# Patient Record
Sex: Female | Born: 1950 | Race: Black or African American | Hispanic: No | Smoking: Never smoker
Health system: Southern US, Community
[De-identification: ages and names within clinical notes are randomized; demographics above are authoritative.]

## PROBLEM LIST (undated history)

## (undated) DIAGNOSIS — E7800 Pure hypercholesterolemia, unspecified: Secondary | ICD-10-CM

## (undated) DIAGNOSIS — R569 Unspecified convulsions: Secondary | ICD-10-CM

## (undated) DIAGNOSIS — J449 Chronic obstructive pulmonary disease, unspecified: Secondary | ICD-10-CM

## (undated) DIAGNOSIS — E119 Type 2 diabetes mellitus without complications: Secondary | ICD-10-CM

## (undated) DIAGNOSIS — I10 Essential (primary) hypertension: Secondary | ICD-10-CM

## (undated) DIAGNOSIS — I639 Cerebral infarction, unspecified: Secondary | ICD-10-CM

## (undated) DIAGNOSIS — I2510 Atherosclerotic heart disease of native coronary artery without angina pectoris: Secondary | ICD-10-CM

## (undated) DIAGNOSIS — R519 Headache, unspecified: Secondary | ICD-10-CM

## (undated) DIAGNOSIS — R51 Headache: Secondary | ICD-10-CM

## (undated) DIAGNOSIS — M1990 Unspecified osteoarthritis, unspecified site: Secondary | ICD-10-CM

## (undated) HISTORY — PX: NASAL SEPTUM SURGERY: SHX37

## (undated) HISTORY — PX: CERVICAL DISC SURGERY: SHX588

## (undated) HISTORY — PX: BACK SURGERY: SHX140

## (undated) HISTORY — PX: VAGINAL HYSTERECTOMY: SUR661

## (undated) HISTORY — PX: FOOT SURGERY: SHX648

## (undated) HISTORY — PX: CARPAL TUNNEL RELEASE: SHX101

---

## 2001-07-19 ENCOUNTER — Ambulatory Visit (HOSPITAL_COMMUNITY): Admission: RE | Admit: 2001-07-19 | Discharge: 2001-07-19 | Payer: Self-pay | Admitting: *Deleted

## 2001-07-19 ENCOUNTER — Encounter: Payer: Self-pay | Admitting: *Deleted

## 2007-07-20 ENCOUNTER — Emergency Department: Payer: Self-pay | Admitting: Emergency Medicine

## 2008-07-02 ENCOUNTER — Emergency Department: Payer: Self-pay | Admitting: Emergency Medicine

## 2012-01-27 HISTORY — PX: CORONARY ANGIOPLASTY WITH STENT PLACEMENT: SHX49

## 2012-09-03 ENCOUNTER — Emergency Department: Payer: Self-pay | Admitting: Emergency Medicine

## 2012-09-03 LAB — COMPREHENSIVE METABOLIC PANEL
Alkaline Phosphatase: 145 U/L — ABNORMAL HIGH (ref 50–136)
Anion Gap: 8 (ref 7–16)
BUN: 11 mg/dL (ref 7–18)
Bilirubin,Total: 0.5 mg/dL (ref 0.2–1.0)
Calcium, Total: 9.5 mg/dL (ref 8.5–10.1)
Co2: 24 mmol/L (ref 21–32)
Creatinine: 0.99 mg/dL (ref 0.60–1.30)
EGFR (Non-African Amer.): >60
Glucose: 139 mg/dL — ABNORMAL HIGH (ref 65–99)
Osmolality: 287 (ref 275–301)
Potassium: 3.9 mmol/L (ref 3.5–5.1)
SGOT(AST): 23 U/L (ref 15–37)
SGPT (ALT): 26 U/L (ref 12–78)
Sodium: 143 mmol/L (ref 136–145)

## 2012-09-03 LAB — CBC
HGB: 13.5 g/dL (ref 12.0–16.0)
MCV: 85 fL (ref 80–100)
Platelet: 207 10*3/uL (ref 150–440)
RDW: 14.5 % (ref 11.5–14.5)

## 2012-09-03 LAB — CK TOTAL AND CKMB (NOT AT ARMC)
CK, Total: 301 U/L — ABNORMAL HIGH (ref 21–215)
CK-MB: 4.3 ng/mL — ABNORMAL HIGH (ref 0.5–3.6)

## 2012-10-21 ENCOUNTER — Observation Stay: Payer: Self-pay | Admitting: Obstetrics and Gynecology

## 2012-10-21 LAB — CBC
HGB: 11.4 g/dL — ABNORMAL LOW (ref 12.0–16.0)
MCHC: 33.7 g/dL (ref 32.0–36.0)
MCV: 85 fL (ref 80–100)
Platelet: 200 10*3/uL (ref 150–440)
RBC: 4.56 10*6/uL (ref 3.80–5.20)
RBC: 3.92 10*6/uL (ref 3.80–5.20)
RDW: 14 % (ref 11.5–14.5)
WBC: 12.6 10*3/uL — ABNORMAL HIGH (ref 3.6–11.0)
WBC: 10.2 10*3/uL (ref 3.6–11.0)

## 2012-10-21 LAB — BASIC METABOLIC PANEL
BUN: 16 mg/dL (ref 7–18)
Chloride: 111 mmol/L — ABNORMAL HIGH (ref 98–107)
EGFR (Non-African Amer.): >60
Glucose: 152 mg/dL — ABNORMAL HIGH (ref 65–99)
Osmolality: 285 (ref 275–301)
Potassium: 3.8 mmol/L (ref 3.5–5.1)

## 2012-11-08 ENCOUNTER — Emergency Department: Payer: Self-pay | Admitting: Emergency Medicine

## 2012-11-08 LAB — COMPREHENSIVE METABOLIC PANEL
Albumin: 3.8 g/dL (ref 3.4–5.0)
Alkaline Phosphatase: 151 U/L — ABNORMAL HIGH (ref 50–136)
Anion Gap: 5 — ABNORMAL LOW (ref 7–16)
BUN: 10 mg/dL (ref 7–18)
Bilirubin,Total: 0.5 mg/dL (ref 0.2–1.0)
Calcium, Total: 9.4 mg/dL (ref 8.5–10.1)
Chloride: 111 mmol/L — ABNORMAL HIGH (ref 98–107)
Co2: 26 mmol/L (ref 21–32)
Creatinine: 0.84 mg/dL (ref 0.60–1.30)
EGFR (African American): >60
EGFR (Non-African Amer.): >60
Glucose: 103 mg/dL — ABNORMAL HIGH (ref 65–99)
Potassium: 3.5 mmol/L (ref 3.5–5.1)
SGPT (ALT): 25 U/L (ref 12–78)
Sodium: 142 mmol/L (ref 136–145)
Total Protein: 7.7 g/dL (ref 6.4–8.2)

## 2012-11-08 LAB — URINALYSIS, COMPLETE
Blood: NEGATIVE
Glucose,UR: NEGATIVE mg/dL (ref 0–75)
RBC,UR: 1 /HPF (ref 0–5)
Squamous Epithelial: 1

## 2012-11-08 LAB — CBC
HGB: 12 g/dL (ref 12.0–16.0)
MCH: 28.8 pg (ref 26.0–34.0)
MCHC: 34.2 g/dL (ref 32.0–36.0)
MCV: 84 fL (ref 80–100)
Platelet: 243 10*3/uL (ref 150–440)
WBC: 9.3 10*3/uL (ref 3.6–11.0)

## 2013-12-12 ENCOUNTER — Inpatient Hospital Stay: Payer: Self-pay | Admitting: Internal Medicine

## 2013-12-12 LAB — CBC
HCT: 37.3 % (ref 35.0–47.0)
HGB: 12.1 g/dL (ref 12.0–16.0)
MCH: 28 pg (ref 26.0–34.0)
MCHC: 32.3 g/dL (ref 32.0–36.0)
MCV: 87 fL (ref 80–100)
Platelet: 175 10*3/uL (ref 150–440)
RBC: 4.31 10*6/uL (ref 3.80–5.20)
RDW: 14.7 % — ABNORMAL HIGH (ref 11.5–14.5)
WBC: 7.9 10*3/uL (ref 3.6–11.0)

## 2013-12-12 LAB — URINALYSIS, COMPLETE
BLOOD: NEGATIVE
Bilirubin,UR: NEGATIVE
GLUCOSE, UR: NEGATIVE mg/dL (ref 0–75)
Ketone: NEGATIVE
Leukocyte Esterase: NEGATIVE
Nitrite: POSITIVE
PH: 5 (ref 4.5–8.0)
PROTEIN: NEGATIVE
RBC,UR: NONE SEEN /HPF (ref 0–5)
SPECIFIC GRAVITY: 1.005 (ref 1.003–1.030)
Squamous Epithelial: 3

## 2013-12-12 LAB — COMPREHENSIVE METABOLIC PANEL
ANION GAP: 6 — AB (ref 7–16)
Albumin: 2.9 g/dL — ABNORMAL LOW (ref 3.4–5.0)
Alkaline Phosphatase: 110 U/L
BUN: 10 mg/dL (ref 7–18)
Bilirubin,Total: 0.3 mg/dL (ref 0.2–1.0)
CALCIUM: 7.5 mg/dL — AB (ref 8.5–10.1)
CO2: 23 mmol/L (ref 21–32)
CREATININE: 0.91 mg/dL (ref 0.60–1.30)
Chloride: 116 mmol/L — ABNORMAL HIGH (ref 98–107)
EGFR (Non-African Amer.): >60
Glucose: 178 mg/dL — ABNORMAL HIGH (ref 65–99)
Osmolality: 292 (ref 275–301)
Potassium: 3.3 mmol/L — ABNORMAL LOW (ref 3.5–5.1)
SGOT(AST): 18 U/L (ref 15–37)
SGPT (ALT): 25 U/L
SODIUM: 145 mmol/L (ref 136–145)
TOTAL PROTEIN: 6 g/dL — AB (ref 6.4–8.2)

## 2013-12-12 LAB — TROPONIN I
Troponin-I: <0.02 ng/mL
Troponin-I: <0.02 ng/mL

## 2013-12-12 LAB — PROTIME-INR
INR: 1.1
Prothrombin Time: 14.2 secs (ref 11.5–14.7)

## 2013-12-12 LAB — APTT: Activated PTT: 24.7 secs (ref 23.6–35.9)

## 2013-12-13 HISTORY — PX: CARDIAC CATHETERIZATION: SHX172

## 2013-12-13 LAB — CBC WITH DIFFERENTIAL/PLATELET
BASOS ABS: 0 10*3/uL (ref 0.0–0.1)
BASOS PCT: 0.3 %
Basophil #: 0.1 10*3/uL (ref 0.0–0.1)
Basophil %: 0.7 %
EOS PCT: 1.2 %
Eosinophil #: 0.1 10*3/uL (ref 0.0–0.7)
Eosinophil #: 0 10*3/uL (ref 0.0–0.7)
Eosinophil %: 0.4 %
HCT: 37.3 % (ref 35.0–47.0)
HCT: 34.8 % — ABNORMAL LOW (ref 35.0–47.0)
HGB: 12.2 g/dL (ref 12.0–16.0)
HGB: 11.1 g/dL — ABNORMAL LOW (ref 12.0–16.0)
LYMPHS ABS: 2.4 10*3/uL (ref 1.0–3.6)
LYMPHS ABS: 1.5 10*3/uL (ref 1.0–3.6)
LYMPHS PCT: 30.8 %
Lymphocyte %: 13.2 %
MCH: 27.9 pg (ref 26.0–34.0)
MCH: 27.7 pg (ref 26.0–34.0)
MCHC: 32.7 g/dL (ref 32.0–36.0)
MCHC: 32 g/dL (ref 32.0–36.0)
MCV: 85 fL (ref 80–100)
MCV: 86 fL (ref 80–100)
MONO ABS: 0.9 x10 3/mm (ref 0.2–0.9)
MONOS PCT: 8 %
MONOS PCT: 7.8 %
Monocyte #: 0.6 x10 3/mm (ref 0.2–0.9)
NEUTROS ABS: 9.1 10*3/uL — AB (ref 1.4–6.5)
Neutrophil #: 4.5 10*3/uL (ref 1.4–6.5)
Neutrophil %: 59.3 %
Neutrophil %: 78.3 %
Platelet: 177 10*3/uL (ref 150–440)
Platelet: 177 10*3/uL (ref 150–440)
RBC: 4.37 10*6/uL (ref 3.80–5.20)
RBC: 4.02 10*6/uL (ref 3.80–5.20)
RDW: 14.5 % (ref 11.5–14.5)
RDW: 14.9 % — ABNORMAL HIGH (ref 11.5–14.5)
WBC: 7.7 10*3/uL (ref 3.6–11.0)
WBC: 11.6 10*3/uL — AB (ref 3.6–11.0)

## 2013-12-13 LAB — BASIC METABOLIC PANEL
ANION GAP: 6 — AB (ref 7–16)
BUN: 9 mg/dL (ref 7–18)
CHLORIDE: 111 mmol/L — AB (ref 98–107)
Calcium, Total: 8.6 mg/dL (ref 8.5–10.1)
Co2: 26 mmol/L (ref 21–32)
Creatinine: 0.79 mg/dL (ref 0.60–1.30)
EGFR (African American): >60
EGFR (Non-African Amer.): >60
GLUCOSE: 153 mg/dL — AB (ref 65–99)
OSMOLALITY: 287 (ref 275–301)
Potassium: 3.7 mmol/L (ref 3.5–5.1)
Sodium: 143 mmol/L (ref 136–145)

## 2013-12-13 LAB — LIPID PANEL
Cholesterol: 127 mg/dL (ref 0–200)
HDL Cholesterol: 67 mg/dL — ABNORMAL HIGH (ref 40–60)
Ldl Cholesterol, Calc: 49 mg/dL (ref 0–100)
Triglycerides: 57 mg/dL (ref 0–200)
VLDL Cholesterol, Calc: 11 mg/dL (ref 5–40)

## 2013-12-13 LAB — TROPONIN I: Troponin-I: <0.02 ng/mL

## 2013-12-14 ENCOUNTER — Inpatient Hospital Stay (HOSPITAL_COMMUNITY)
Admission: AD | Admit: 2013-12-14 | Discharge: 2013-12-18 | DRG: 315 | Disposition: A | Discharge status: Home / Self Care | Payer: Medicare Other | Source: Other Acute Inpatient Hospital | Attending: Cardiothoracic Surgery | Admitting: Cardiothoracic Surgery

## 2013-12-14 ENCOUNTER — Inpatient Hospital Stay: Admit: 2013-12-14 | Payer: Self-pay | Admitting: Cardiothoracic Surgery

## 2013-12-14 ENCOUNTER — Other Ambulatory Visit: Payer: Self-pay | Admitting: *Deleted

## 2013-12-14 DIAGNOSIS — I671 Cerebral aneurysm, nonruptured: Secondary | ICD-10-CM

## 2013-12-14 DIAGNOSIS — R079 Chest pain, unspecified: Secondary | ICD-10-CM | POA: Diagnosis present

## 2013-12-14 DIAGNOSIS — I2510 Atherosclerotic heart disease of native coronary artery without angina pectoris: Secondary | ICD-10-CM | POA: Diagnosis present

## 2013-12-14 DIAGNOSIS — S301XXA Contusion of abdominal wall, initial encounter: Secondary | ICD-10-CM | POA: Diagnosis present

## 2013-12-14 DIAGNOSIS — E785 Hyperlipidemia, unspecified: Secondary | ICD-10-CM | POA: Diagnosis present

## 2013-12-14 DIAGNOSIS — M1990 Unspecified osteoarthritis, unspecified site: Secondary | ICD-10-CM | POA: Diagnosis present

## 2013-12-14 DIAGNOSIS — B9689 Other specified bacterial agents as the cause of diseases classified elsewhere: Secondary | ICD-10-CM | POA: Diagnosis present

## 2013-12-14 DIAGNOSIS — N390 Urinary tract infection, site not specified: Secondary | ICD-10-CM | POA: Diagnosis present

## 2013-12-14 DIAGNOSIS — Z01810 Encounter for preprocedural cardiovascular examination: Secondary | ICD-10-CM | POA: Diagnosis not present

## 2013-12-14 DIAGNOSIS — M47896 Other spondylosis, lumbar region: Secondary | ICD-10-CM | POA: Diagnosis present

## 2013-12-14 DIAGNOSIS — Z8673 Personal history of transient ischemic attack (TIA), and cerebral infarction without residual deficits: Secondary | ICD-10-CM

## 2013-12-14 DIAGNOSIS — E780 Pure hypercholesterolemia: Secondary | ICD-10-CM | POA: Diagnosis present

## 2013-12-14 DIAGNOSIS — K661 Hemoperitoneum: Secondary | ICD-10-CM

## 2013-12-14 DIAGNOSIS — I10 Essential (primary) hypertension: Secondary | ICD-10-CM | POA: Diagnosis present

## 2013-12-14 DIAGNOSIS — Z955 Presence of coronary angioplasty implant and graft: Secondary | ICD-10-CM

## 2013-12-14 DIAGNOSIS — E119 Type 2 diabetes mellitus without complications: Secondary | ICD-10-CM | POA: Diagnosis present

## 2013-12-14 DIAGNOSIS — I2541 Coronary artery aneurysm: Principal | ICD-10-CM | POA: Diagnosis present

## 2013-12-14 DIAGNOSIS — Z8679 Personal history of other diseases of the circulatory system: Secondary | ICD-10-CM

## 2013-12-14 DIAGNOSIS — G40909 Epilepsy, unspecified, not intractable, without status epilepticus: Secondary | ICD-10-CM | POA: Diagnosis present

## 2013-12-14 DIAGNOSIS — Y848 Other medical procedures as the cause of abnormal reaction of the patient, or of later complication, without mention of misadventure at the time of the procedure: Secondary | ICD-10-CM | POA: Diagnosis present

## 2013-12-14 DIAGNOSIS — R42 Dizziness and giddiness: Secondary | ICD-10-CM | POA: Diagnosis present

## 2013-12-14 HISTORY — DX: Unspecified osteoarthritis, unspecified site: M19.90

## 2013-12-14 HISTORY — DX: Headache: R51

## 2013-12-14 HISTORY — DX: Cerebral infarction, unspecified: I63.9

## 2013-12-14 HISTORY — DX: Chronic obstructive pulmonary disease, unspecified: J44.9

## 2013-12-14 HISTORY — DX: Pure hypercholesterolemia, unspecified: E78.00

## 2013-12-14 HISTORY — DX: Unspecified convulsions: R56.9

## 2013-12-14 HISTORY — DX: Type 2 diabetes mellitus without complications: E11.9

## 2013-12-14 HISTORY — DX: Headache, unspecified: R51.9

## 2013-12-14 HISTORY — DX: Essential (primary) hypertension: I10

## 2013-12-14 HISTORY — DX: Atherosclerotic heart disease of native coronary artery without angina pectoris: I25.10

## 2013-12-14 LAB — GLUCOSE, CAPILLARY
Glucose-Capillary: 104 mg/dL — ABNORMAL HIGH (ref 70–99)
Glucose-Capillary: 281 mg/dL — ABNORMAL HIGH (ref 70–99)
Glucose-Capillary: 174 mg/dL — ABNORMAL HIGH (ref 70–99)

## 2013-12-14 LAB — BLOOD GAS, ARTERIAL
Acid-Base Excess: 0.5 mmol/L (ref 0.0–2.0)
Bicarbonate: 24.1 mEq/L — ABNORMAL HIGH (ref 20.0–24.0)
Drawn by: 23588
FIO2: 0.21 %
O2 Saturation: 93.2 %
Patient temperature: 98.6
TCO2: 25.2 mmol/L (ref 0–100)
pCO2 arterial: 35.7 mmHg (ref 35.0–45.0)
pH, Arterial: 7.445 (ref 7.350–7.450)
pO2, Arterial: 68.1 mmHg — ABNORMAL LOW (ref 80.0–100.0)

## 2013-12-14 LAB — COMPREHENSIVE METABOLIC PANEL
ALT: 12 U/L (ref 0–35)
AST: 14 U/L (ref 0–37)
Albumin: 3 g/dL — ABNORMAL LOW (ref 3.5–5.2)
Alkaline Phosphatase: 108 U/L (ref 39–117)
Anion gap: 13 (ref 5–15)
BUN: 13 mg/dL (ref 6–23)
CO2: 22 mEq/L (ref 19–32)
Calcium: 8.8 mg/dL (ref 8.4–10.5)
Chloride: 106 mEq/L (ref 96–112)
Creatinine, Ser: 0.82 mg/dL (ref 0.50–1.10)
GFR calc Af Amer: 85 mL/min — ABNORMAL LOW (ref >90)
GFR calc non Af Amer: 73 mL/min — ABNORMAL LOW (ref >90)
Glucose, Bld: 162 mg/dL — ABNORMAL HIGH (ref 70–99)
Potassium: 3.8 mEq/L (ref 3.7–5.3)
Sodium: 141 mEq/L (ref 137–147)
Total Bilirubin: 0.2 mg/dL — ABNORMAL LOW (ref 0.3–1.2)
Total Protein: 6.4 g/dL (ref 6.0–8.3)

## 2013-12-14 LAB — CBC WITH DIFFERENTIAL/PLATELET
Basophil #: 0 10*3/uL (ref 0.0–0.1)
Basophil %: 0.4 %
EOS ABS: 0.1 10*3/uL (ref 0.0–0.7)
Eosinophil %: 0.5 %
HCT: 32.8 % — AB (ref 35.0–47.0)
HGB: 10.9 g/dL — AB (ref 12.0–16.0)
LYMPHS ABS: 1.7 10*3/uL (ref 1.0–3.6)
Lymphocyte %: 17.4 %
MCH: 28.4 pg (ref 26.0–34.0)
MCHC: 33.1 g/dL (ref 32.0–36.0)
MCV: 86 fL (ref 80–100)
Monocyte #: 0.9 x10 3/mm (ref 0.2–0.9)
Monocyte %: 9.4 %
NEUTROS ABS: 7.2 10*3/uL — AB (ref 1.4–6.5)
NEUTROS PCT: 72.3 %
Platelet: 163 10*3/uL (ref 150–440)
RBC: 3.83 10*6/uL (ref 3.80–5.20)
RDW: 15 % — ABNORMAL HIGH (ref 11.5–14.5)
WBC: 9.9 10*3/uL (ref 3.6–11.0)

## 2013-12-14 LAB — URINE CULTURE

## 2013-12-14 MED ORDER — GLIMEPIRIDE 2 MG PO TABS
2.0000 mg | ORAL_TABLET | Freq: Every day | ORAL | Status: DC
  Administered 2013-12-15 – 2013-12-18 (×4): 2 mg via ORAL
  Filled 2013-12-14 (×5): qty 1

## 2013-12-14 MED ORDER — ESLICARBAZEPINE ACETATE 200 MG PO TABS
200.0000 mg | ORAL_TABLET | Freq: Every morning | ORAL | Status: DC
  Administered 2013-12-15 – 2013-12-18 (×4): 200 mg via ORAL
  Filled 2013-12-14 (×4): qty 1

## 2013-12-14 MED ORDER — DARIFENACIN HYDROBROMIDE ER 7.5 MG PO TB24
7.5000 mg | ORAL_TABLET | Freq: Every day | ORAL | Status: DC
  Administered 2013-12-14 – 2013-12-18 (×5): 7.5 mg via ORAL
  Filled 2013-12-14 (×5): qty 1

## 2013-12-14 MED ORDER — SODIUM CHLORIDE 0.9 % IJ SOLN
3.0000 mL | Freq: Two times a day (BID) | INTRAMUSCULAR | Status: DC
  Administered 2013-12-14 – 2013-12-17 (×7): 3 mL via INTRAVENOUS

## 2013-12-14 MED ORDER — GABAPENTIN 300 MG PO CAPS
300.0000 mg | ORAL_CAPSULE | Freq: Two times a day (BID) | ORAL | Status: DC
  Administered 2013-12-14 – 2013-12-18 (×9): 300 mg via ORAL
  Filled 2013-12-14 (×10): qty 1

## 2013-12-14 MED ORDER — ALBUTEROL SULFATE (2.5 MG/3ML) 0.083% IN NEBU
2.5000 mg | INHALATION_SOLUTION | RESPIRATORY_TRACT | Status: DC | PRN
  Administered 2013-12-15: 2.5 mg via RESPIRATORY_TRACT

## 2013-12-14 MED ORDER — ACETAMINOPHEN 325 MG PO TABS: 650.0000 mg | ORAL_TABLET | Freq: Four times a day (QID) | ORAL | Status: DC | PRN

## 2013-12-14 MED ORDER — INSULIN ASPART 100 UNIT/ML ~~LOC~~ SOLN
4.0000 [IU] | Freq: Three times a day (TID) | SUBCUTANEOUS | Status: DC
  Administered 2013-12-14 – 2013-12-18 (×11): 4 [IU] via SUBCUTANEOUS

## 2013-12-14 MED ORDER — CIPROFLOXACIN HCL 500 MG PO TABS
500.0000 mg | ORAL_TABLET | Freq: Two times a day (BID) | ORAL | Status: DC
Start: 2013-12-14 — End: 2013-12-18
  Administered 2013-12-14 – 2013-12-18 (×8): 500 mg via ORAL
  Filled 2013-12-14 (×10): qty 1

## 2013-12-14 MED ORDER — SODIUM CHLORIDE 0.9 % IV SOLN: 250.0000 mL | INTRAVENOUS | Status: DC | PRN

## 2013-12-14 MED ORDER — PANTOPRAZOLE SODIUM 40 MG PO TBEC
40.0000 mg | DELAYED_RELEASE_TABLET | Freq: Every day | ORAL | Status: DC
  Administered 2013-12-14 – 2013-12-18 (×5): 40 mg via ORAL
  Filled 2013-12-14 (×3): qty 1

## 2013-12-14 MED ORDER — MONTELUKAST SODIUM 10 MG PO TABS
10.0000 mg | ORAL_TABLET | Freq: Every day | ORAL | Status: DC
  Administered 2013-12-14 – 2013-12-17 (×4): 10 mg via ORAL
  Filled 2013-12-14 (×5): qty 1

## 2013-12-14 MED ORDER — ZOLPIDEM TARTRATE 5 MG PO TABS: 5.0000 mg | ORAL_TABLET | Freq: Every evening | ORAL | Status: DC | PRN

## 2013-12-14 MED ORDER — AMLODIPINE BESYLATE 5 MG PO TABS
5.0000 mg | ORAL_TABLET | Freq: Every day | ORAL | Status: DC
  Administered 2013-12-14 – 2013-12-17 (×4): 5 mg via ORAL
  Filled 2013-12-14 (×4): qty 1

## 2013-12-14 MED ORDER — ASPIRIN EC 81 MG PO TBEC
81.0000 mg | DELAYED_RELEASE_TABLET | Freq: Every day | ORAL | Status: DC
  Administered 2013-12-14 – 2013-12-18 (×5): 81 mg via ORAL
  Filled 2013-12-14 (×5): qty 1

## 2013-12-14 MED ORDER — SODIUM CHLORIDE 0.9 % IJ SOLN: 3.0000 mL | INTRAMUSCULAR | Status: DC | PRN

## 2013-12-14 MED ORDER — MOMETASONE FURO-FORMOTEROL FUM 100-5 MCG/ACT IN AERO
2.0000 | INHALATION_SPRAY | Freq: Two times a day (BID) | RESPIRATORY_TRACT | Status: DC
  Administered 2013-12-14 – 2013-12-18 (×7): 2 via RESPIRATORY_TRACT
  Filled 2013-12-14: qty 8.8

## 2013-12-14 MED ORDER — SORBITOL 70 % SOLN
30.0000 mL | Freq: Every day | Status: DC | PRN
  Filled 2013-12-14: qty 30

## 2013-12-14 MED ORDER — DOCUSATE SODIUM 100 MG PO CAPS
100.0000 mg | ORAL_CAPSULE | Freq: Two times a day (BID) | ORAL | Status: DC
  Administered 2013-12-14 – 2013-12-18 (×9): 100 mg via ORAL
  Filled 2013-12-14 (×10): qty 1

## 2013-12-14 MED ORDER — ACETAMINOPHEN 650 MG RE SUPP: 650.0000 mg | Freq: Four times a day (QID) | RECTAL | Status: DC | PRN

## 2013-12-14 MED ORDER — SODIUM CHLORIDE 0.9 % IJ SOLN
3.0000 mL | Freq: Two times a day (BID) | INTRAMUSCULAR | Status: DC
  Administered 2013-12-14 – 2013-12-17 (×6): 3 mL via INTRAVENOUS

## 2013-12-14 MED ORDER — SIMVASTATIN 5 MG PO TABS
5.0000 mg | ORAL_TABLET | Freq: Every day | ORAL | Status: DC
  Administered 2013-12-14 – 2013-12-17 (×4): 5 mg via ORAL
  Filled 2013-12-14 (×5): qty 1

## 2013-12-14 MED ORDER — ISOSORBIDE MONONITRATE ER 30 MG PO TB24
30.0000 mg | ORAL_TABLET | Freq: Every day | ORAL | Status: DC
  Administered 2013-12-14 – 2013-12-18 (×5): 30 mg via ORAL
  Filled 2013-12-14 (×5): qty 1

## 2013-12-14 MED ORDER — ALUM & MAG HYDROXIDE-SIMETH 200-200-20 MG/5ML PO SUSP: 30.0000 mL | Freq: Four times a day (QID) | ORAL | Status: DC | PRN

## 2013-12-14 MED ORDER — ENOXAPARIN SODIUM 40 MG/0.4ML ~~LOC~~ SOLN
40.0000 mg | Freq: Every day | SUBCUTANEOUS | Status: DC
  Administered 2013-12-14 – 2013-12-17 (×4): 40 mg via SUBCUTANEOUS
  Filled 2013-12-14 (×4): qty 0.4

## 2013-12-14 MED ORDER — LACOSAMIDE 50 MG PO TABS
200.0000 mg | ORAL_TABLET | Freq: Two times a day (BID) | ORAL | Status: DC
Start: 2013-12-14 — End: 2013-12-18
  Administered 2013-12-14 – 2013-12-18 (×8): 200 mg via ORAL
  Filled 2013-12-14 (×11): qty 4

## 2013-12-14 MED ORDER — INSULIN ASPART 100 UNIT/ML ~~LOC~~ SOLN
0.0000 [IU] | Freq: Every day | SUBCUTANEOUS | Status: DC
  Administered 2013-12-15 – 2013-12-17 (×2): 2 [IU] via SUBCUTANEOUS

## 2013-12-14 MED ORDER — HYDROCODONE-ACETAMINOPHEN 5-325 MG PO TABS
1.0000 | ORAL_TABLET | ORAL | Status: DC | PRN
  Administered 2013-12-15 (×2): 1 via ORAL
  Filled 2013-12-14 (×2): qty 1

## 2013-12-14 MED ORDER — LISINOPRIL 20 MG PO TABS
20.0000 mg | ORAL_TABLET | Freq: Every day | ORAL | Status: DC
  Administered 2013-12-14 – 2013-12-18 (×5): 20 mg via ORAL
  Filled 2013-12-14 (×5): qty 1

## 2013-12-14 MED ORDER — ALBUTEROL SULFATE HFA 108 (90 BASE) MCG/ACT IN AERS: 2.0000 | INHALATION_SPRAY | RESPIRATORY_TRACT | Status: DC | PRN

## 2013-12-14 NOTE — Progress Notes (Signed)
  Echocardiogram 2D Echocardiogram has been performed.  Arvil ChacoFoster, Bosten Newstrom 12/14/2013, 3:45 PM

## 2013-12-14 NOTE — Progress Notes (Signed)
NURSING PROGRESS NOTE  Krista PaulaRoberta S Revoir [id number] Transfer Data: 12/14/2013 11:20 AM Attending Provider: Kerin PernaPeter Van Trigt, MD PCP:No primary care provider on file. Code Status: Full    Krista Frazier is a 63 y.o. female patient [facility]  [facility]  -No acute distress noted.  -No complaints of shortness of breath.  -No complaints of chest pain.   Cardiac Monitoring: Box # [id number] in place. Cardiac monitor yields:normal sinus rhythm.  Blood pressure 100/60, pulse 69, temperature 98.6 F (37 C), temperature source Oral, resp. rate 18, height 5\' 4"  (1.626 m), weight 74.1 kg (163 lb 5.8 oz), SpO2 99 %.   IV Fluids:  IV in place, occlusive dsg intact without redness, IV cath hand right, condition patent and no redness none.   Allergies:  Review of patient's allergies indicates no known allergies.  Past Medical History:   has no past medical history on file.  Past Surgical History:   has no past surgical history on file.  Social History:     Skin: Clean, Dry, Intact.  Incision on the right groin, Dressing in place, clean dry and intact  Patient/Family orientated to room. Information packet given to patient/family. Admission inpatient armband information verified with patient/family to include name and date of birth and placed on patient arm. Side rails up x 2, fall assessment and education completed with patient/family. Patient/family able to verbalize understanding of risk associated with falls and verbalized understanding to call for assistance before getting out of bed. Call light within reach. Patient/family able to voice and demonstrate understanding of unit orientation instructions.    Will continue to evaluate and treat per MD orders.
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
## 2013-12-15 ENCOUNTER — Encounter (HOSPITAL_COMMUNITY): Payer: Self-pay | Admitting: General Practice

## 2013-12-15 ENCOUNTER — Inpatient Hospital Stay (HOSPITAL_COMMUNITY): Payer: Medicare Other

## 2013-12-15 DIAGNOSIS — Z8679 Personal history of other diseases of the circulatory system: Secondary | ICD-10-CM

## 2013-12-15 DIAGNOSIS — R079 Chest pain, unspecified: Secondary | ICD-10-CM

## 2013-12-15 DIAGNOSIS — I2510 Atherosclerotic heart disease of native coronary artery without angina pectoris: Secondary | ICD-10-CM

## 2013-12-15 DIAGNOSIS — Z01810 Encounter for preprocedural cardiovascular examination: Secondary | ICD-10-CM

## 2013-12-15 LAB — PULMONARY FUNCTION TEST
DL/VA % pred: 74 %
DL/VA: 3.58 ml/min/mmHg/L
DLCO cor % pred: 51 %
DLCO cor: 12.58 ml/min/mmHg
DLCO unc % pred: 51 %
DLCO unc: 12.58 ml/min/mmHg
FEF 25-75 Post: 1.91 L/sec
FEF 25-75 Pre: 1.17 L/sec
FEF2575-%Change-Post: 64 %
FEF2575-%Pred-Post: 102 %
FEF2575-%Pred-Pre: 62 %
FEV1-%Change-Post: 11 %
FEV1-%Pred-Post: 90 %
FEV1-%Pred-Pre: 81 %
FEV1-Post: 1.78 L
FEV1-Pre: 1.59 L
FEV1FVC-%Change-Post: 1 %
FEV1FVC-%Pred-Pre: 96 %
FEV6-%Change-Post: 2 %
FEV6-%Pred-Post: 88 %
FEV6-%Pred-Pre: 86 %
FEV6-Post: 2.15 L
FEV6-Pre: 2.1 L
FEV6FVC-%Change-Post: 0 %
FEV6FVC-%Pred-Post: 103 %
FEV6FVC-%Pred-Pre: 103 %
FVC-%Change-Post: 10 %
FVC-%Pred-Post: 93 %
FVC-%Pred-Pre: 84 %
FVC-Post: 2.34 L
FVC-Pre: 2.11 L
Post FEV1/FVC ratio: 76 %
Post FEV6/FVC ratio: 100 %
Pre FEV1/FVC ratio: 75 %
Pre FEV6/FVC Ratio: 99 %
RV % pred: 116 %
RV: 2.43 L
TLC % pred: 85 %
TLC: 4.3 L

## 2013-12-15 LAB — GLUCOSE, CAPILLARY
Glucose-Capillary: 118 mg/dL — ABNORMAL HIGH (ref 70–99)
Glucose-Capillary: 154 mg/dL — ABNORMAL HIGH (ref 70–99)
Glucose-Capillary: 183 mg/dL — ABNORMAL HIGH (ref 70–99)
Glucose-Capillary: 233 mg/dL — ABNORMAL HIGH (ref 70–99)

## 2013-12-15 LAB — HEMOGLOBIN A1C
Hgb A1c MFr Bld: 7.7 % — ABNORMAL HIGH (ref <5.7)
Mean Plasma Glucose: 174 mg/dL — ABNORMAL HIGH (ref <117)

## 2013-12-15 MED ORDER — IOHEXOL 300 MG/ML  SOLN
80.0000 mL | Freq: Once | INTRAMUSCULAR | Status: AC | PRN
  Administered 2013-12-15: 80 mL via INTRAVENOUS

## 2013-12-15 MED ORDER — POTASSIUM CHLORIDE CRYS ER 20 MEQ PO TBCR
20.0000 meq | EXTENDED_RELEASE_TABLET | Freq: Once | ORAL | Status: AC
  Administered 2013-12-15: 20 meq via ORAL
  Filled 2013-12-15: qty 1

## 2013-12-15 NOTE — Progress Notes (Addendum)
      [address]       [place] [id number]             [phone]        Subjective: She is just waking up this am. She denies chest pain or shortness of breath.  Objective: Vital signs in last 24 hours: Temp:  [97.8 F (36.6 C)-98.6 F (37 C)] 98.5 F (36.9 C) (11/20 0346) Pulse Rate:  [69-73] 73 (11/20 0346) Cardiac Rhythm:  [-] Normal sinus rhythm (11/19 1930) Resp:  [17-18] 18 (11/20 0346) BP: (99-120)/(60-62) 108/62 mmHg (11/20 0346) SpO2:  [96 %-100 %] 96 % (11/20 0346) Weight:  [163 lb 5.8 oz (74.1 kg)-163 lb 9.3 oz (74.2 kg)] 163 lb 9.3 oz (74.2 kg) (11/20 0346)   Current Weight  12/15/13 163 lb 9.3 oz (74.2 kg)       Intake/Output from previous day: 11/19 0701 - 11/20 0700 In: 480 [P.O.:480] Out: 2045 [Urine:2045]   Physical Exam:  Cardiovascular: RRR, no murmurs, gallops, or rubs. Pulmonary: Clear to auscultation bilaterally; no rales, wheezes, or rhonchi. Abdomen: Soft, non tender, bowel sounds present. Extremities: Trace lower extremity edema.   Lab Results: CBC:No results for input(s): WBC, HGB, HCT, PLT in the last 72 hours. BMET:  Recent Labs  12/14/13 2140  NA 141  K 3.8  CL 106  CO2 22  GLUCOSE 162*  BUN 13  CREATININE 0.82  CALCIUM 8.8    PT/INR: No results found for: INR, PROTIME ABG:  INR: Will add last result for INR, ABG once components are confirmed Will add last 4 CBG results once components are confirmed  Assessment/Plan:  1. CV - SR in the 60's. Await CT of chest results. 2. Potassium last night 3.8 so will supplement today 3. 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 Her chest pain is very convincing for angina. She does not have any coronary stenoses. I have asked for cardiology consultation for a second opinion-but the plan now is to proceed with plication of the coronary  aneurysm and bypass grafts to the LAD and circumflex  She has no evidence of aneurysms of the thoracic aorta, cerebrovascular system, or abdominal aorta.  She does have a large 9 cm pelvic hematoma from the cath site at the right femoral artery. We'll plan re-scanning on that this weekend prior to surgery  patient examined and medical record reviewed,agree with above note. VAN TRIGT III,Brittanee Ghazarian 12/15/2013

## 2013-12-15 NOTE — Progress Notes (Signed)
UR Completed.  Louanna Vanliew Jane [phone] 12/15/2013  

## 2013-12-15 NOTE — Progress Notes (Signed)
VASCULAR LAB PRELIMINARY  PRELIMINARY  PRELIMINARY  PRELIMINARY  Pre-op Cardiac Surgery  Carotid Findings:  Bilateral:  1-39% ICA stenosis.  Vertebral artery flow is antegrade.        Upper Extremity Right Left  Brachial Pressures 115  Triphasic  98  Triphasic   Radial Waveforms Triphasic  Triphasic   Ulnar Waveforms Triphasic  Triphasic   Palmar Arch (Allen's Test) Within normal limits Within normal limits      Lower  Extremity Right Left  Dorsalis Pedis 169  Triphasic  146  Triphasic   Anterior Tibial    Posterior Tibial 143  Triphasic 150  Triphasic   Ankle/Brachial Indices 1.47 1.3     Demico Ploch, RVT 12/15/2013, 6:17 PM

## 2013-12-15 NOTE — Progress Notes (Signed)
CARDIAC REHAB PHASE I   PRE:  Rate/Rhythm: 65 SR    BP: lying 90/556, sitting 130/70    SaO2: 99 RA  MODE:  Ambulation: 300 ft   POST:  Rate/Rhythm: 85 SR    BP: sitting 90/60     SaO2: 100 RA  Got permission from surgeon to walk. Pt able to stand from EOB without using arms. Needed verbal cues. Steady walking with RW. No dizziness or CP. Felt good and apparently walked farther than she does at home. To recliner.  [phone]   Elissa LovettReeve, Jacek Colson [place]aine[place]an CES, ACSM 12/15/2013 1:28 PM

## 2013-12-15 NOTE — Progress Notes (Signed)
MD ordered for pt foley to be discontinue and monitor pt to see if she can void after foley removal; pt foley removed at 1400 and voided x2 unmeasured. Arabella MerlesP. Amo Kedarius Aloisi RN.

## 2013-12-15 NOTE — Progress Notes (Signed)
CARDIAC REHAB PHASE I   Discussed sternal precautions and postop mobility. Gave pt OHS booklet and guideline. Also gave instructions for video to watch. Gave instructions for IS. I am not sure of pts safety for ambulation given LM/Cx aneurysm, dizziness with standing, and extraperitoneal hematoma. Please address this. Thank you. [phone] Elissa LovettReeve, Josph [place] SpringfieldKristan CES, ACSM 12/15/2013 11:47 AM

## 2013-12-16 ENCOUNTER — Inpatient Hospital Stay (HOSPITAL_COMMUNITY): Payer: Medicare Other

## 2013-12-16 DIAGNOSIS — I2510 Atherosclerotic heart disease of native coronary artery without angina pectoris: Secondary | ICD-10-CM | POA: Insufficient documentation

## 2013-12-16 DIAGNOSIS — Z8679 Personal history of other diseases of the circulatory system: Secondary | ICD-10-CM | POA: Insufficient documentation

## 2013-12-16 DIAGNOSIS — R072 Precordial pain: Secondary | ICD-10-CM

## 2013-12-16 LAB — URINE CULTURE
Colony Count: NO GROWTH
Culture: NO GROWTH

## 2013-12-16 LAB — GLUCOSE, CAPILLARY
Glucose-Capillary: 104 mg/dL — ABNORMAL HIGH (ref 70–99)
Glucose-Capillary: 91 mg/dL (ref 70–99)
Glucose-Capillary: 119 mg/dL — ABNORMAL HIGH (ref 70–99)
Glucose-Capillary: 138 mg/dL — ABNORMAL HIGH (ref 70–99)

## 2013-12-16 LAB — BASIC METABOLIC PANEL
Anion gap: 17 — ABNORMAL HIGH (ref 5–15)
BUN: 12 mg/dL (ref 6–23)
CO2: 19 mEq/L (ref 19–32)
Calcium: 9 mg/dL (ref 8.4–10.5)
Chloride: 106 mEq/L (ref 96–112)
Creatinine, Ser: 0.81 mg/dL (ref 0.50–1.10)
GFR calc Af Amer: 86 mL/min — ABNORMAL LOW (ref >90)
GFR calc non Af Amer: 75 mL/min — ABNORMAL LOW (ref >90)
Glucose, Bld: 113 mg/dL — ABNORMAL HIGH (ref 70–99)
Potassium: 4.2 mEq/L (ref 3.7–5.3)
Sodium: 142 mEq/L (ref 137–147)

## 2013-12-16 LAB — CBC
HCT: 34.1 % — ABNORMAL LOW (ref 36.0–46.0)
Hemoglobin: 11 g/dL — ABNORMAL LOW (ref 12.0–15.0)
MCH: 27.8 pg (ref 26.0–34.0)
MCHC: 32.3 g/dL (ref 30.0–36.0)
MCV: 86.3 fL (ref 78.0–100.0)
Platelets: 190 10*3/uL (ref 150–400)
RBC: 3.95 MIL/uL (ref 3.87–5.11)
RDW: 14.2 % (ref 11.5–15.5)
WBC: 8.3 10*3/uL (ref 4.0–10.5)

## 2013-12-16 NOTE — Plan of Care (Signed)
Problem: Phase I Progression Outcomes Goal: Pain controlled with appropriate interventions Outcome: Completed/Met Date Met:  12/16/13     

## 2013-12-16 NOTE — Consult Note (Signed)
CARDIOLOGY CONSULT NOTE      Patient ID: ASMA BOLDON MRN: [id number] DOB/AGE: 07/05/1950 63 y.o.  Admit date: 12/14/2013 Referring PhysicianPeter Donata Clay, MD Primary PhysicianPcp Not In System Primary Cardiologist Dr. Milta Deiters Reason for Consultation coronary artery disease  HPI: 63 y/o woman who had dizziness and was admitted to [facility].  She developed some chest discomfort on the way to the hospital while with EMS.  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  She was referred to [facility] for surgical management.  Upon speaking to the patient, she denies any chest discomfort at this time.  She is afraid that she may have signed a paper consenting to an operation.  She states this evening that "everything is ok" and she "does not want surgery.  She awaits the arrival if her sisters tomorrow that can tell more information.    Review of systems complete and found to be negative unless listed above   Past Medical History  Diagnosis Date  . Coronary artery disease   . Headache     "not too often" (12/15/2013)  . Stroke     "weak on left arm/leg since"  . Seizures     "? kind" (12/15/2013)  . Arthritis     "arms and knees" (12/15/2013)  . Type II diabetes mellitus   . COPD (chronic obstructive pulmonary disease)     [name] 12/14/2013  . Hypertension     [name] 12/14/2013  . High cholesterol     [name] 12/14/2013    History reviewed. No pertinent family history.  History   Social History  . Marital Status: Widowed    Spouse Name: N/A    Number of Children: N/A  . Years of Education: N/A   Occupational History  . Not on file.   Social History Main Topics  . Smoking status: Never Smoker   . Smokeless tobacco: Never Used  . Alcohol Use: No  . Drug Use: No  . Sexual Activity: No   Other Topics Concern  . Not on file   Social History Narrative  . No narrative on file    Past Surgical  History  Procedure Laterality Date  . Vaginal hysterectomy    . Back surgery    . Cervical disc surgery    . Foot surgery Right     "took a bone out"  . Coronary angioplasty with stent placement  2014    [name] 12/14/2013  . Cardiac catheterization  12/13/2013    [name] 12/14/2013  . Nasal septum surgery    . Carpal tunnel release Bilateral      Prescriptions prior to admission  Medication Sig Dispense Refill Last Dose  . albuterol (PROVENTIL HFA;VENTOLIN HFA) 108 (90 BASE) MCG/ACT inhaler Inhale 2 puffs into the lungs every 6 (six) hours as needed for wheezing or shortness of breath.   12/13/2013 at pm  . amLODipine (NORVASC) 5 MG tablet Take 5 mg by mouth daily.   12/13/2013 at Unknown time  . aspirin EC 81 MG tablet Take 81 mg by mouth daily.   12/13/2013 at Unknown time  . clopidogrel (PLAVIX) 75 MG tablet Take 75 mg by mouth daily.   12/13/2013 at Unknown time  . docusate sodium (COLACE) 100 MG capsule Take 100 mg by mouth at bedtime.    12/13/2013 at Unknown time  . Eslicarbazepine Acetate (APTIOM) 200 MG TABS Take 200 mg by mouth daily.  12/13/2013 at Unknown time  . fluticasone (FLONASE) 50 MCG/ACT nasal spray Place 2 sprays into both nostrils daily.   12/13/2013 at Unknown time  . Fluticasone-Salmeterol (ADVAIR) 500-50 MCG/DOSE AEPB Inhale 1 puff into the lungs 2 (two) times daily.   12/13/2013 at Unknown time  . gabapentin (NEURONTIN) 300 MG capsule Take 300-600 mg by mouth 3 (three) times daily. 1 capsule twice daily and 2 capsules at bedtime   12/13/2013 at Unknown time  . glimepiride (AMARYL) 2 MG tablet Take 2 mg by mouth daily with breakfast.   12/13/2013 at Unknown time  . isosorbide mononitrate (IMDUR) 30 MG 24 hr tablet Take 30 mg by mouth daily.   12/13/2013 at Unknown time  . lacosamide (VIMPAT) 200 MG TABS tablet Take 200 mg by mouth 2 (two) times daily.   12/13/2013 at Unknown time  . lisinopril (PRINIVIL,ZESTRIL) 20 MG tablet Take 20 mg by mouth daily.    12/13/2013 at Unknown time  . montelukast (SINGULAIR) 10 MG tablet Take 10 mg by mouth at bedtime.   12/13/2013 at Unknown time  . nitroGLYCERIN (NITROSTAT) 0.4 MG SL tablet Place 0.4 mg under the tongue every 5 (five) minutes as needed for chest pain.   12/13/2013 at Unknown time  . Oyster Shell (OYSTER CALCIUM) 500 MG TABS tablet Take 500 mg of elemental calcium by mouth daily.   12/13/2013 at Unknown time  . pantoprazole (PROTONIX) 40 MG tablet Take 40 mg by mouth daily.   12/13/2013 at Unknown time  . potassium chloride SA (K-DUR,KLOR-CON) 20 MEQ tablet Take 20 mEq by mouth daily.   12/13/2013 at Unknown time  . ranitidine (ZANTAC) 150 MG tablet Take 150 mg by mouth at bedtime.   12/13/2013 at Unknown time  . simvastatin (ZOCOR) 5 MG tablet Take 5 mg by mouth daily.   12/13/2013 at Unknown time  . solifenacin (VESICARE) 10 MG tablet Take 10 mg by mouth daily.   12/13/2013 at Unknown time    Physical Exam: Vitals:   Filed Vitals:   12/15/13 1239 12/15/13 1417 12/15/13 2027 12/15/13 2110  BP: 108/76 116/62  144/65  Pulse: 84 76  83  Temp: 98.1 F (36.7 C) 99.1 F (37.3 C)  98.7 F (37.1 C)  TempSrc: Oral Oral  Oral  Resp: 18 18  18   Height:      Weight:      SpO2: 98% 99% 97% 100%   I&O's:   Intake/Output Summary (Last 24 hours) at 12/16/13 0411 Last data filed at 12/15/13 1800  Gross per 24 hour  Intake    840 ml  Output   1600 ml  Net   -760 ml   Physical exam:  [place]/AT EOMI No JVD, No carotid bruit RRR S1S2  No wheezing Soft. NT, nondistended No edema. No focal motor or sensory deficits Anxious affect  Labs:  No results found for: WBC, HGB, HCT, MCV, PLT  Recent Labs Lab 12/14/13 2140  NA 141  K 3.8  CL 106  CO2 22  BUN 13  CREATININE 0.82  CALCIUM 8.8  PROT 6.4  BILITOT 0.2*  ALKPHOS 108  ALT 12  AST 14  GLUCOSE 162*   No results found for: CKTOTAL, CKMB, CKMBINDEX, TROPONINI No results found for: CHOL No results found for: HDL No results found  for: LDLCALC No results found for: TRIG No results found for: CHOLHDL No results found for: LDLDIRECT     ZOX:WRUEEKG:none available  ASSESSMENT AND PLAN:  1) Cardiac: large coronary  aneurysm. Best  Management strategy is unclear to me at the time.  The patient is adament at the moment about not having open heart surgery,.  She thinks everything is ok.  She is concerned she may have signed consent for the procedure.  I have personally reviewed the cath films.  Since the aneurysm occurs at the bifurcation of the distal left main intio the circumflex, covered stent would not be an option.  There are no percutaneous options for repair at this time.  Either she has a surgical repair or continues with meical therapy.  If the risk of rupture is high, would recommend surgery. Otherwise, may be able to follow with serial CT angiogram.    Control BP.  COntinue statin.   Active Problems:   Chest pain   Signed:   Fredric MareJay S. Varanasi, MD, [facility] Army [facility] HospitalFACC 12/16/2013, 4:11 AM

## 2013-12-16 NOTE — Plan of Care (Signed)
Problem: Phase I Progression Outcomes Goal: Vascular site scale level 0 - I Vascular Site Scale Level 0: No bruising/bleeding/hematoma Level I (Mild): Bruising/Ecchymosis, minimal bleeding/ooozing, palpable hematoma < 3 cm Level II (Moderate): Bleeding not affecting hemodynamic parameters, pseudoaneurysm, palpable hematoma > 3 cm Level III (Severe) Bleeding which affects hemodynamic parameters or retroperitoneal hemorrhage  Outcome: Completed/Met Date Met:  12/16/13 Level 0

## 2013-12-16 NOTE — Progress Notes (Signed)
Notified Gershon CraneWayne Gold, [place]PA of CT findings of retroperitoneal hematoma.  No further orders received.  Nsg to continue to monitor.

## 2013-12-16 NOTE — Progress Notes (Addendum)
[address]       [place], [id number]             [phone]        Procedure(s) (LRB): CORONARY ARTERY BYPASS GRAFTING (CABG) (N/A) TRANSESOPHAGEAL ECHOCARDIOGRAM (TEE) (N/A) Subjective: Feels well, denies chest pain or shortness of breath  Objective: Vital signs in last 24 hours: Temp:  [98.1 F (36.7 C)-99.1 F (37.3 C)] 98.6 F (37 C) (11/21 0457) Pulse Rate:  [76-84] 79 (11/21 0457) Cardiac Rhythm:  [-] Normal sinus rhythm (11/20 1955) Resp:  [17-18] 17 (11/21 0457) BP: (108-144)/(62-76) 111/70 mmHg (11/21 0457) SpO2:  [97 %-100 %] 99 % (11/21 0457) Weight:  [162 lb 14.7 oz (73.9 kg)] 162 lb 14.7 oz (73.9 kg) (11/21 0457)  Hemodynamic parameters for last 24 hours:    Intake/Output from previous day: 11/20 0701 - 11/21 0700 In: 840 [P.O.:840] Out: 1500 [Urine:1500] Intake/Output this shift:    General appearance: alert, cooperative and no distress Heart: regular rate and rhythm Lungs: clear to auscultation bilaterally Abdomen: benign Extremities: no edema  Lab Results:  Recent Labs  12/16/13 0153  WBC 8.3  HGB 11.0*  HCT 34.1*  PLT 190   BMET:  Recent Labs  12/14/13 2140 12/16/13 0153  NA 141 142  K 3.8 4.2  CL 106 106  CO2 22 19  GLUCOSE 162* 113*  BUN 13 12  CREATININE 0.82 0.81  CALCIUM 8.8 9.0    PT/INR: No results for input(s): LABPROT, INR in the last 72 hours. ABG    Component Value Date/Time   PHART 7.445 12/14/2013 1455   HCO3 24.1* 12/14/2013 1455   TCO2 25.2 12/14/2013 1455   O2SAT 93.2 12/14/2013 1455   CBG (last 3)   Recent Labs  12/15/13 1658 12/15/13 2108 12/16/13 0623  GLUCAP 183* 233* 104*    Meds Scheduled Meds: . amLODipine  5 mg Oral Daily  . aspirin EC  81 mg Oral Daily  . ciprofloxacin  500 mg Oral BID  . darifenacin  7.5 mg Oral Daily  . docusate sodium  100 mg Oral BID  . enoxaparin (LOVENOX) injection  40 mg Subcutaneous Daily  . Eslicarbazepine Acetate  200 mg Oral q  morning - 10a  . gabapentin  300 mg Oral BID  . glimepiride  2 mg Oral Q breakfast  . insulin aspart  0-5 Units Subcutaneous QHS  . insulin aspart  4 Units Subcutaneous TID WC  . isosorbide mononitrate  30 mg Oral Daily  . lacosamide  200 mg Oral BID  . lisinopril  20 mg Oral Daily  . mometasone-formoterol  2 puff Inhalation BID  . montelukast  10 mg Oral QHS  . pantoprazole  40 mg Oral Q1200  . simvastatin  5 mg Oral q1800  . sodium chloride  3 mL Intravenous Q12H  . sodium chloride  3 mL Intravenous Q12H   Continuous Infusions:  PRN Meds:.sodium chloride, acetaminophen **OR** acetaminophen, albuterol, alum & mag hydroxide-simeth, HYDROcodone-acetaminophen, sodium chloride, sorbitol, zolpidem  Xrays X-ray Chest Pa And Lateral  12/15/2013   CLINICAL DATA:  Chest pain and shortness of breath on 12 December 2013  EXAM: CHEST  2 VIEW  COMPARISON:  CT scan of the chest of today's date  FINDINGS: The lung volumes are low. Interstitial markings are increased bilaterally. There is no alveolar infiltrate. The cardiopericardial silhouette is normal in size. The pulmonary vascularity is not engorged. There is tortuosity of the ascending and descending  thoracic aorta. No significant pleural effusion is evident. There is no pneumothorax. The bony thorax is unremarkable.  IMPRESSION: Bilateral pulmonary hypoinflation with increased pulmonary interstitial markings. This may reflect mild interstitial edema of cardiac or noncardiac calls. There is no alveolar pneumonia.   Electronically Signed   By: David  SwazilandJordan   On: 12/15/2013 07:35   Ct Head W Wo Contrast  12/15/2013   CLINICAL DATA:  Dizziness and fall December 12, 2013.  EXAM: CT HEAD WITHOUT AND WITH CONTRAST  TECHNIQUE: Contiguous axial images were obtained from the base of the skull through the vertex without and with intravenous contrast  CONTRAST:  80mL OMNIPAQUE IOHEXOL 300 MG/ML  SOLN  COMPARISON:  None.  FINDINGS: The ventricles and sulci are  normal for age. No intraparenchymal hemorrhage, mass effect nor midline shift. Mild supratentorial white matter hypodensities are within normal range for patient's age and though non-specific suggest sequelae of chronic small vessel ischemic disease. No acute large vascular territory infarcts. 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 No abnormal parenchymal nor extra-axial enhancement.  No abnormal extra-axial fluid collections. Basal cisterns are patent. Moderate calcific atherosclerosis of the carotid siphons.  No skull fracture. The included ocular globes and orbital contents are non-suspicious. The mastoid aircells and included paranasal sinuses are well-aerated. Cervical facet screws seen on localizer.  IMPRESSION: No acute intracranial process.  Status post LEFT suboccipital craniotomy with subjacent encephalomalacia. No suspicious intracranial enhancement.   Electronically Signed   By: Awilda Metroourtnay  Bloomer   On: 12/15/2013 03:42   Ct Chest W Contrast  12/15/2013   CLINICAL DATA:  Chest pain and shortness of Breath.  EXAM: CT CHEST WITH CONTRAST  TECHNIQUE: Multidetector CT imaging of the chest was performed during intravenous contrast administration.  CONTRAST:  80mL OMNIPAQUE IOHEXOL 300 MG/ML  SOLN  COMPARISON:  None.  FINDINGS: Chest wall: No supraclavicular or axillary mass or lymphadenopathy. There are left-sided thyroid lobe lesions. The largest measures 18 mm. Recommend correlation with ultrasound examination and consideration for biopsy. No obvious breast masses. The bony thorax is intact. No destructive bone lesions or spinal canal compromise.  Mediastinum: The heart is normal in size. No pericardial effusion. No mediastinal or hilar mass or lymphadenopathy. Small scattered lymph nodes are noted. The aorta is normal in caliber. No dissection. The branch vessels are patent. The pulmonary arteries appear normal. The esophagus is unremarkable.  Lungs: There is  dependent bibasilar atelectasis, right greater than left. No definite infiltrates, edema or effusions. No interstitial lung disease or bronchiectasis. The tracheobronchial tree is unremarkable. There is a 5 mm right upper lobe pulmonary nodule on image number 27. This is an indeterminate finding which will require follow-up. No other pulmonary nodules are identified.  Upper abdomen:  No significant findings.  IMPRESSION: 1. Patchy bibasilar dependent atelectasis, right greater than left but no infiltrates, edema or effusions. 2. No mediastinal or hilar mass or adenopathy. 3. 5 mm left upper lobe pulmonary nodule. If the patient is at high risk for bronchogenic carcinoma, follow-up chest CT at 6-12 months is recommended. If the patient is at low risk for bronchogenic carcinoma, follow-up chest CT at 12 months is recommended. This recommendation follows the consensus statement: Guidelines for Management of Small Pulmonary Nodules Detected on CT Scans: A Statement from the Fleischner Society as published in Radiology 2005;237:395-400. 4. 18 mm left thyroid lobe lesion.  Recommend ultrasound followup.   Electronically Signed   By: Loralie ChampagneMark  Gallerani M.D.   On: 12/15/2013 08:00  Assessment/Plan: S/P Procedure(s) (LRB): CORONARY ARTERY BYPASS GRAFTING (CABG) (N/A) TRANSESOPHAGEAL ECHOCARDIOGRAM (TEE) (N/A)  1 clinically stable, awaits surgery   LOS: 2 days    GOLD,WAYNE E 12/16/2013  CT abd shows large R pelvic hematoma from cat 8 cm , sl smaller than after cath Surgery - high dose heparin should be held for now Will review situation with pts family- no more chest pain  patient examined and medical record reviewed,agree with above note. VAN TRIGT III,Aiyannah Fayad 12/16/2013

## 2013-12-16 NOTE — Progress Notes (Signed)
CARDIAC REHAB PHASE I   PRE:  Rate/Rhythm: 81 sinus rhythm  BP:  Supine:   Sitting: 130/76  Standing:    SaO2: 1005 ra  MODE:  Ambulation: 890 ft,    POST:  Rate/Rhythem: 81 sinus rhythm  BP:  Supine:   Sitting: 126/83  Standing:    SaO2: 100% ra  1035-1125 Pt ambulated in hallway x1 assist using rolling walker.  Pt drifts to right during walk.  Asymptomatic.  Pt tearful and anxious about her pending surgery.  Pt anxiously awaiting arrival of her sister to discuss surgery.   Pt spirits lifted when she talked to sister on phone.  Pt reassured and comforted.  Pt eager to walk and requests to walk to nurses station again.  Pt instructed to watch preparing for surgery video.  Answered pt questions about surgery.  Pt returned to chair with call light in reach.  Pt instructed to walk with staff. Understanding verbalized   Dan EuropeRion, Bryla Burek [place]

## 2013-12-16 NOTE — Plan of Care (Signed)
Problem: Phase II - Intermediate Post-Op Goal: Pain controlled with appropriate interventions Outcome: Completed/Met Date Met:  12/16/13

## 2013-12-16 NOTE — Plan of Care (Signed)
Problem: Phase I Progression Outcomes Goal: Voiding-avoid urinary catheter unless indicated Outcome: Completed/Met Date Met:  12/16/13     

## 2013-12-16 NOTE — Progress Notes (Signed)
    Subjective:  Denies CP or dyspnea   Objective:  Filed Vitals:   12/15/13 2027 12/15/13 2110 12/16/13 0457 12/16/13 0816  BP:  144/65 111/70   Pulse:  83 79   Temp:  98.7 F (37.1 C) 98.6 F (37 C)   TempSrc:  Oral Oral   Resp:  18 17   Height:      Weight:   162 lb 14.7 oz (73.9 kg)   SpO2: 97% 100% 99% 98%    Intake/Output from previous day:  Intake/Output Summary (Last 24 hours) at 12/16/13 0912 Last data filed at 12/16/13 0457  Gross per 24 hour  Intake    480 ml  Output   1500 ml  Net  -1020 ml    Physical Exam: Physical exam: Well-developed well-nourished in no acute distress.  Skin is warm and dry.  HEENT is normal.  Neck is supple. Chest is clear to auscultation with normal expansion.  Cardiovascular exam is regular rate and rhythm.  Abdominal exam nontender or distended. No masses palpated. Right groin with no palpable hematoma and no bruit Extremities show no edema. neuro grossly intact    Lab Results: Basic Metabolic Panel:  Recent Labs  11/91/4711/19/15 2140 12/16/13 0153  NA 141 142  K 3.8 4.2  CL 106 106  CO2 22 19  GLUCOSE 162* 113*  BUN 13 12  CREATININE 0.82 0.81  CALCIUM 8.8 9.0   CBC:  Recent Labs  12/16/13 0153  WBC 8.3  HGB 11.0*  HCT 34.1*  MCV 86.3  PLT 190     Assessment/Plan:  1 coronary artery aneurysm-notes have been reviewed. Dr. Donata ClayVan Trigt feels patient will require surgery. Dr. Eldridge DaceVaranasi has reviewed films and feels percutaneous intervention is not an option. Patient is hesitant to proceed with surgery. She would like for her sister to discuss options with cardiothoracic surgery. She will then make her decision concerning proceeding. We'll continue aspirin, statin, calcium blocker and nitrates. No chest pain at present. 2 hypertension-blood pressure controlled. Continue present medications. 3 lung nodule-she will need follow-up chest CT in 6 months. 4 groin hematoma-This does not appear to be expanding on examination  today.  Olga MillersBrian Crenshaw 12/16/2013, 9:12 AM

## 2013-12-17 LAB — CULTURE, BLOOD (SINGLE)

## 2013-12-17 LAB — GLUCOSE, CAPILLARY
Glucose-Capillary: 145 mg/dL — ABNORMAL HIGH (ref 70–99)
Glucose-Capillary: 153 mg/dL — ABNORMAL HIGH (ref 70–99)
Glucose-Capillary: 88 mg/dL (ref 70–99)
Glucose-Capillary: 220 mg/dL — ABNORMAL HIGH (ref 70–99)

## 2013-12-17 MED ORDER — METOPROLOL SUCCINATE ER 25 MG PO TB24
25.0000 mg | ORAL_TABLET | Freq: Every day | ORAL | Status: DC
  Administered 2013-12-17 – 2013-12-18 (×2): 25 mg via ORAL
  Filled 2013-12-17 (×2): qty 1

## 2013-12-17 MED ORDER — RANOLAZINE ER 500 MG PO TB12
500.0000 mg | ORAL_TABLET | Freq: Two times a day (BID) | ORAL | Status: DC
  Administered 2013-12-17 – 2013-12-18 (×2): 500 mg via ORAL
  Filled 2013-12-17 (×3): qty 1

## 2013-12-17 NOTE — Progress Notes (Addendum)
[address]       [place], [id number]             [phone]        Procedure(s) (LRB): CORONARY ARTERY BYPASS GRAFTING (CABG) (N/A) TRANSESOPHAGEAL ECHOCARDIOGRAM (TEE) (N/A) Subjective: Feels fine, no specific c/o and denies CP/SOB  Objective: Vital signs in last 24 hours: Temp:  [98 F (36.7 C)-98.4 F (36.9 C)] 98.4 F (36.9 C) (11/22 0429) Pulse Rate:  [72-98] 76 (11/22 0429) Cardiac Rhythm:  [-] Normal sinus rhythm (11/21 1930) Resp:  [16-18] 16 (11/22 0429) BP: (116-123)/(55-76) 119/58 mmHg (11/22 0429) SpO2:  [97 %-100 %] 99 % (11/22 0429) Weight:  [164 lb 7.4 oz (74.6 kg)] 164 lb 7.4 oz (74.6 kg) (11/22 0429)  Hemodynamic parameters for last 24 hours:    Intake/Output from previous day: 11/21 0701 - 11/22 0700 In: 200 [P.O.:200] Out: -  Intake/Output this shift:    General appearance: alert, cooperative and no distress Heart: regular rate and rhythm Lungs: clear to auscultation bilaterally Abdomen: benign exam Extremities: no edema  Lab Results:  Recent Labs  12/16/13 0153  WBC 8.3  HGB 11.0*  HCT 34.1*  PLT 190   BMET:  Recent Labs  12/14/13 2140 12/16/13 0153  NA 141 142  K 3.8 4.2  CL 106 106  CO2 22 19  GLUCOSE 162* 113*  BUN 13 12  CREATININE 0.82 0.81  CALCIUM 8.8 9.0    PT/INR: No results for input(s): LABPROT, INR in the last 72 hours. ABG    Component Value Date/Time   PHART 7.445 12/14/2013 1455   HCO3 24.1* 12/14/2013 1455   TCO2 25.2 12/14/2013 1455   O2SAT 93.2 12/14/2013 1455   CBG (last 3)   Recent Labs  12/16/13 1625 12/16/13 2046 12/17/13 0554  GLUCAP 119* 138* 145*    Meds Scheduled Meds: . amLODipine  5 mg Oral Daily  . aspirin EC  81 mg Oral Daily  . ciprofloxacin  500 mg Oral BID  . darifenacin  7.5 mg Oral Daily  . docusate sodium  100 mg Oral BID  . enoxaparin (LOVENOX) injection  40 mg Subcutaneous Daily  . Eslicarbazepine Acetate  200 mg Oral q morning - 10a  .  gabapentin  300 mg Oral BID  . glimepiride  2 mg Oral Q breakfast  . insulin aspart  0-5 Units Subcutaneous QHS  . insulin aspart  4 Units Subcutaneous TID WC  . isosorbide mononitrate  30 mg Oral Daily  . lacosamide  200 mg Oral BID  . lisinopril  20 mg Oral Daily  . mometasone-formoterol  2 puff Inhalation BID  . montelukast  10 mg Oral QHS  . pantoprazole  40 mg Oral Q1200  . simvastatin  5 mg Oral q1800  . sodium chloride  3 mL Intravenous Q12H  . sodium chloride  3 mL Intravenous Q12H   Continuous Infusions:  PRN Meds:.sodium chloride, acetaminophen **OR** acetaminophen, albuterol, alum & mag hydroxide-simeth, HYDROcodone-acetaminophen, sodium chloride, sorbitol, zolpidem  Xrays Ct Abdomen Pelvis Wo Contrast  12/16/2013   CLINICAL DATA:  Retroperitoneal bleed. Low hemoglobin. Generalized weakness.  EXAM: CT ABDOMEN AND PELVIS WITHOUT CONTRAST  TECHNIQUE: Multidetector CT imaging of the abdomen and pelvis was performed following the standard protocol without IV contrast.  COMPARISON:  Chest CT 12/15/2013  FINDINGS: Lower chest:  Lung bases are clear.  No pericardial fluid.  Hepatobiliary: Non IV contrast images demonstrate no focal hepatic lesion. The gallbladder  is normal.  Pancreas: Pancreas is normal. No ductal dilatation. No pancreatic inflammation.  Spleen: Normal spleen  Adrenals/urinary tract: Adrenal glands and kidneys are normal. Ureters and bladder normal.  Small amount of gas in the bladder consistent with recent catheterization. The bladder is deformed along its right border by a moderate size retroperitoneal hematoma.  Stomach/Bowel: Stomach, small bowel, appendix and cecum normal. The colon and rectosigmoid colon are normal.  Vascular/Lymphatic: Abdominal or is normal caliber. No retroperitoneal periportal lymphadenopathy.  Within the right obturator space there is a rounded high-density fluid collection consistent with hematoma. This hematoma measures 6.6 x 3.7 cm in axial  dimension and 7 cm in craniocaudad dimension. The hematoma extends from the level of the pubic symphysis to the S1 vertebral body. Calculated volume estimation of hematoma equals 500 cc.  Reproductive: Post hysterectomy anatomy.  Musculoskeletal: No aggressive osseous lesion. Multiple levels of endplate degeneration.  IMPRESSION:  Retroperitoneal hematoma in the right obturator space does not extend outside of the pelvis. Estimated volume calculated at 500 cc.  Findings conveyed toWillie, floor nurseon 12/16/2013  at16:07.   Electronically Signed   By: Genevive BiStewart  Edmunds M.D.   On: 12/16/2013 16:07    Assessment/Plan: S/P Procedure(s) (LRB): CORONARY ARTERY BYPASS GRAFTING (CABG) (N/A) TRANSESOPHAGEAL ECHOCARDIOGRAM (TEE) (N/A)  1 conts to remain clinically stable 2 plan for surgery- retroperitoneal hematoma is delaying d/t need for high dose heparin during surgery and risks of bleeding   LOS: 3 days    GOLD,WAYNE E 12/17/2013  Conference with patient and family They wish to hold off on surgery- plication of coronary aneurysm and CABG- in favor of medical therapy Will change norvasc to metoprolol 25 bid and add Ranexa If she continues to have pain  she will notify her MD and will be readmitted for surgey If she has no recurrent pain I will follow cor aneurysm with annual cardiac CT  RTC in 3 wks

## 2013-12-17 NOTE — Progress Notes (Signed)
Patient Name: Krista Frazier Date of Encounter: 12/17/2013     Active Problems:   Chest pain   Coronary artery disease involving native coronary artery of native heart without angina pectoris   Hx of aneurysm    SUBJECTIVE  Denies any CP or SOB. No significant abdominal pain  CURRENT MEDS . amLODipine  5 mg Oral Daily  . aspirin EC  81 mg Oral Daily  . ciprofloxacin  500 mg Oral BID  . darifenacin  7.5 mg Oral Daily  . docusate sodium  100 mg Oral BID  . enoxaparin (LOVENOX) injection  40 mg Subcutaneous Daily  . Eslicarbazepine Acetate  200 mg Oral q morning - 10a  . gabapentin  300 mg Oral BID  . glimepiride  2 mg Oral Q breakfast  . insulin aspart  0-5 Units Subcutaneous QHS  . insulin aspart  4 Units Subcutaneous TID WC  . isosorbide mononitrate  30 mg Oral Daily  . lacosamide  200 mg Oral BID  . lisinopril  20 mg Oral Daily  . mometasone-formoterol  2 puff Inhalation BID  . montelukast  10 mg Oral QHS  . pantoprazole  40 mg Oral Q1200  . simvastatin  5 mg Oral q1800  . sodium chloride  3 mL Intravenous Q12H  . sodium chloride  3 mL Intravenous Q12H    OBJECTIVE  Filed Vitals:   12/16/13 1431 12/16/13 2047 12/16/13 2051 12/17/13 0429  BP: 118/76 123/55  119/58  Pulse: 85 72  76  Temp: 98 F (36.7 C) 98.4 F (36.9 C)  98.4 F (36.9 C)  TempSrc: Oral Oral  Oral  Resp: 18 17  16   Height:      Weight:    164 lb 7.4 oz (74.6 kg)  SpO2: 100% 100% 100% 99%    Intake/Output Summary (Last 24 hours) at 12/17/13 0801 Last data filed at 12/16/13 1000  Gross per 24 hour  Intake    200 ml  Output      0 ml  Net    200 ml   Filed Weights   12/15/13 0346 12/16/13 0457 12/17/13 0429  Weight: 163 lb 9.3 oz (74.2 kg) 162 lb 14.7 oz (73.9 kg) 164 lb 7.4 oz (74.6 kg)    PHYSICAL EXAM  General: Pleasant, NAD. Neuro: Alert and oriented X 3. Moves all extremities spontaneously. Psych: Normal affect. HEENT:  Normal  Neck: Supple without bruits or JVD. Lungs:   Resp regular and unlabored, CTA. Heart: RRR no s3, s4, or murmurs. Abdomen: Soft, non-tender, non-distended, BS + x 4.  Extremities: No clubbing, cyanosis or edema. DP/PT/Radials 2+ and equal bilaterally.  Accessory Clinical Findings  CBC  Recent Labs  12/16/13 0153  WBC 8.3  HGB 11.0*  HCT 34.1*  MCV 86.3  PLT 190   Basic Metabolic Panel  Recent Labs  12/14/13 2140 12/16/13 0153  NA 141 142  K 3.8 4.2  CL 106 106  CO2 22 19  GLUCOSE 162* 113*  BUN 13 12  CREATININE 0.82 0.81  CALCIUM 8.8 9.0   Liver Function Tests  Recent Labs  12/14/13 2140  AST 14  ALT 12  ALKPHOS 108  BILITOT 0.2*  PROT 6.4  ALBUMIN 3.0*   Hemoglobin A1C  Recent Labs  12/15/13 0452  HGBA1C 7.7*     TELE NSR with HR 70-80s, no significant ventricular ectopy    ECG  No EKG  Echocardiogram 12/14/2013  LV EF: 55% -  60%  -------------------------------------------------------------------  Indications:   Chest pain 786.51.  ------------------------------------------------------------------- History:  PMH: Coronary anuerysm.  ------------------------------------------------------------------- Study Conclusions  - Left ventricle: The cavity size was normal. Wall thickness was increased in a pattern of mild LVH. Systolic function was normal. The estimated ejection fraction was in the range of 55% to 60%. Wall motion was normal; there were no regional wall motion abnormalities.    Radiology/Studies  Ct Abdomen Pelvis Wo Contrast  12/16/2013   CLINICAL DATA:  Retroperitoneal bleed. Low hemoglobin. Generalized weakness.  EXAM: CT ABDOMEN AND PELVIS WITHOUT CONTRAST  TECHNIQUE: Multidetector CT imaging of the abdomen and pelvis was performed following the standard protocol without IV contrast.  COMPARISON:  Chest CT 12/15/2013  FINDINGS: Lower chest:  Lung bases are clear.  No pericardial fluid.  Hepatobiliary: Non IV contrast images demonstrate no focal  hepatic lesion. The gallbladder is normal.  Pancreas: Pancreas is normal. No ductal dilatation. No pancreatic inflammation.  Spleen: Normal spleen  Adrenals/urinary tract: Adrenal glands and kidneys are normal. Ureters and bladder normal.  Small amount of gas in the bladder consistent with recent catheterization. The bladder is deformed along its right border by a moderate size retroperitoneal hematoma.  Stomach/Bowel: Stomach, small bowel, appendix and cecum normal. The colon and rectosigmoid colon are normal.  Vascular/Lymphatic: Abdominal or is normal caliber. No retroperitoneal periportal lymphadenopathy.  Within the right obturator space there is a rounded high-density fluid collection consistent with hematoma. This hematoma measures 6.6 x 3.7 cm in axial dimension and 7 cm in craniocaudad dimension. The hematoma extends from the level of the pubic symphysis to the S1 vertebral body. Calculated volume estimation of hematoma equals 500 cc.  Reproductive: Post hysterectomy anatomy.  Musculoskeletal: No aggressive osseous lesion. Multiple levels of endplate degeneration.  IMPRESSION:  Retroperitoneal hematoma in the right obturator space does not extend outside of the pelvis. Estimated volume calculated at 500 cc.  Findings conveyed toWillie, floor nurseon 12/16/2013  at16:07.   Electronically Signed   By: Genevive Bi M.D.   On: 12/16/2013 16:07   X-ray Chest Pa And Lateral  12/15/2013   CLINICAL DATA:  Chest pain and shortness of breath on 12 December 2013  EXAM: CHEST  2 VIEW  COMPARISON:  CT scan of the chest of today's date  FINDINGS: The lung volumes are low. Interstitial markings are increased bilaterally. There is no alveolar infiltrate. The cardiopericardial silhouette is normal in size. The pulmonary vascularity is not engorged. There is tortuosity of the ascending and descending thoracic aorta. No significant pleural effusion is evident. There is no pneumothorax. The bony thorax is unremarkable.   IMPRESSION: Bilateral pulmonary hypoinflation with increased pulmonary interstitial markings. This may reflect mild interstitial edema of cardiac or noncardiac calls. There is no alveolar pneumonia.   Electronically Signed   By: David  [place]   On: 12/15/2013 07:35   Ct Head W Wo Contrast  12/15/2013   CLINICAL DATA:  Dizziness and fall December 12, 2013.  EXAM: CT HEAD WITHOUT AND WITH CONTRAST  TECHNIQUE: Contiguous axial images were obtained from the base of the skull through the vertex without and with intravenous contrast  CONTRAST:  80mL OMNIPAQUE IOHEXOL 300 MG/ML  SOLN  COMPARISON:  None.  FINDINGS: The ventricles and sulci are normal for age. No intraparenchymal hemorrhage, mass effect nor midline shift. Mild supratentorial white matter hypodensities are within normal range for patient's age and though non-specific suggest sequelae of chronic small vessel ischemic disease. No acute large vascular territory infarcts. Status post suboccipital cranioplasty  with LEFT cerebellum encephalomalacia. No abnormal parenchymal nor extra-axial enhancement.  No abnormal extra-axial fluid collections. Basal cisterns are patent. Moderate calcific atherosclerosis of the carotid siphons.  No skull fracture. The included ocular globes and orbital contents are non-suspicious. The mastoid aircells and included paranasal sinuses are well-aerated. Cervical facet screws seen on localizer.  IMPRESSION: No acute intracranial process.  Status post LEFT suboccipital craniotomy with subjacent encephalomalacia. No suspicious intracranial enhancement.   Electronically Signed   By: Awilda Metro   On: 12/15/2013 03:42   Ct Chest W Contrast  12/15/2013   CLINICAL DATA:  Chest pain and shortness of Breath.  EXAM: CT CHEST WITH CONTRAST  TECHNIQUE: Multidetector CT imaging of the chest was performed during intravenous contrast administration.  CONTRAST:  80mL OMNIPAQUE IOHEXOL 300 MG/ML  SOLN  COMPARISON:  None.  FINDINGS: Chest  wall: No supraclavicular or axillary mass or lymphadenopathy. There are left-sided thyroid lobe lesions. The largest measures 18 mm. Recommend correlation with ultrasound examination and consideration for biopsy. No obvious breast masses. The bony thorax is intact. No destructive bone lesions or spinal canal compromise.  Mediastinum: The heart is normal in size. No pericardial effusion. No mediastinal or hilar mass or lymphadenopathy. Small scattered lymph nodes are noted. The aorta is normal in caliber. No dissection. The branch vessels are patent. The pulmonary arteries appear normal. The esophagus is unremarkable.  Lungs: There is dependent bibasilar atelectasis, right greater than left. No definite infiltrates, edema or effusions. No interstitial lung disease or bronchiectasis. The tracheobronchial tree is unremarkable. There is a 5 mm right upper lobe pulmonary nodule on image number 27. This is an indeterminate finding which will require follow-up. No other pulmonary nodules are identified.  Upper abdomen:  No significant findings.  IMPRESSION: 1. Patchy bibasilar dependent atelectasis, right greater than left but no infiltrates, edema or effusions. 2. No mediastinal or hilar mass or adenopathy. 3. 5 mm left upper lobe pulmonary nodule. If the patient is at high risk for bronchogenic carcinoma, follow-up chest CT at 6-12 months is recommended. If the patient is at low risk for bronchogenic carcinoma, follow-up chest CT at 12 months is recommended. This recommendation follows the consensus statement: Guidelines for Management of Small Pulmonary Nodules Detected on CT Scans: A Statement from the Fleischner Society as published in Radiology 2005;237:395-400. 4. 18 mm left thyroid lobe lesion.  Recommend ultrasound followup.   Electronically Signed   By: Loralie Champagne M.D.   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 Dr. Donata Clay feels patient will  require surgery. Dr. Eldridge Dace has reviewed films and feels percutaneous intervention is not an option.   - cath 11/18 2.5 x 3.5 cm aneurysm of the proximal left circumflex, extending into the left main  - Echo 12/14/2013 EF 55-60%, no RWMA  - carotid u/s, no significant stenosis, 1-39% bilaterally  - sister is coming around 2PM to discuss with CT surgery, pending final decision on whether to proceed with surgery later today.  2 hypertension-blood pressure controlled. Continue present medications.  3 5mm LUL lung nodule-she will need follow-up chest CT in 6 months. 4 groin hematoma-This does not appear to be expanding on examination today.  Signed, Azalee Course PA-C Pager: [phone] As above, patient seen and examined. Patient denies chest pain or dyspnea. Continue present medications. Patient's sister is apparently scheduled to be here today at 2 PM to assist with final discussions concerning surgery versus medical therapy. We  are following. Olga MillersBrian Drue Harr

## 2013-12-18 ENCOUNTER — Encounter (HOSPITAL_COMMUNITY)
Admission: AD | Disposition: A | Discharge status: Home / Self Care | Payer: Self-pay | Source: Other Acute Inpatient Hospital | Attending: Cardiothoracic Surgery

## 2013-12-18 DIAGNOSIS — R0789 Other chest pain: Secondary | ICD-10-CM

## 2013-12-18 LAB — GLUCOSE, CAPILLARY
Glucose-Capillary: 129 mg/dL — ABNORMAL HIGH (ref 70–99)
Glucose-Capillary: 254 mg/dL — ABNORMAL HIGH (ref 70–99)
Glucose-Capillary: 149 mg/dL — ABNORMAL HIGH (ref 70–99)

## 2013-12-18 SURGERY — CORONARY ARTERY BYPASS GRAFTING (CABG)
Anesthesia: General | Site: Chest

## 2013-12-18 MED ORDER — METOPROLOL SUCCINATE ER 25 MG PO TB24: 25.0000 mg | ORAL_TABLET | Freq: Every day | ORAL | Status: DC

## 2013-12-18 MED ORDER — RANOLAZINE ER 500 MG PO TB12: 500.0000 mg | ORAL_TABLET | Freq: Two times a day (BID) | ORAL | Status: DC

## 2013-12-18 NOTE — Progress Notes (Signed)
CARDIAC REHAB PHASE I   PRE:  Rate/Rhythm: 62 SR  BP:  Supine:   Sitting: 110/80  Standing:    SaO2: 99 RA  MODE:  Ambulation: 850 ft   POST:  Rate/Rhythm: 69  BP:  Supine:   Sitting: 110/70  Standing:    SaO2: 98 RA [id number] On arrival pt ask to go to the bathroom. Assisted to bathroom, then to ambulate. Used walker and assisted X 1. Pt able to walk 850 feet without c/o.Pt to recliner after walk with call light in reach. Pt very anxious to go home.  Melina CopaLisa Victormanuel Mclure RN 12/18/2013 9:47 AM

## 2013-12-18 NOTE — Progress Notes (Signed)
Patient: Krista Frazier / Admit Date: 12/14/2013 / Date of Encounter: 12/18/2013, 9:47 AM   Subjective: Feeling well. Felt great with cardiac rehab.   Objective: Telemetry: NSR Physical Exam: Blood pressure 101/56, pulse 68, temperature 98.1 F (36.7 C), temperature source Oral, resp. rate 15, height 5\' 4"  (1.626 m), weight 169 lb 15.6 oz (77.1 kg), SpO2 98 %. General: Well developed, well nourished, in no acute distress. Head: Normocephalic, atraumatic, sclera non-icteric, no xanthomas, nares are without discharge. Neck:  JVP not elevated. Lungs: Clear bilaterally to auscultation without wheezes, rales, or rhonchi. Breathing is unlabored. Heart: RRR S1 S2 without murmurs, rubs, or gallops.  Abdomen: Soft, non-tender, non-distended with normoactive bowel sounds. No rebound/guarding. Extremities: No clubbing or cyanosis. No edema. Distal pedal pulses are 2+ and equal bilaterally. Right groin - no ecchymosis, hematoma or bruit Neuro: Alert and oriented X 3. Moves all extremities spontaneously. Psych:  Responds to questions appropriately with a normal affect.   Intake/Output Summary (Last 24 hours) at 12/18/13 0947 Last data filed at 12/17/13 2223  Gross per 24 hour  Intake    246 ml  Output      0 ml  Net    246 ml    Inpatient Medications:  . aspirin EC  81 mg Oral Daily  . ciprofloxacin  500 mg Oral BID  . darifenacin  7.5 mg Oral Daily  . docusate sodium  100 mg Oral BID  . Eslicarbazepine Acetate  200 mg Oral q morning - 10a  . gabapentin  300 mg Oral BID  . glimepiride  2 mg Oral Q breakfast  . insulin aspart  0-5 Units Subcutaneous QHS  . insulin aspart  4 Units Subcutaneous TID WC  . isosorbide mononitrate  30 mg Oral Daily  . lacosamide  200 mg Oral BID  . lisinopril  20 mg Oral Daily  . metoprolol succinate  25 mg Oral Daily  . mometasone-formoterol  2 puff Inhalation BID  . montelukast  10 mg Oral QHS  . pantoprazole  40 mg Oral Q1200  . ranolazine  500 mg  Oral BID  . simvastatin  5 mg Oral q1800  . sodium chloride  3 mL Intravenous Q12H  . sodium chloride  3 mL Intravenous Q12H   Infusions:    Labs:  Recent Labs  12/16/13 0153  NA 142  K 4.2  CL 106  CO2 19  GLUCOSE 113*  BUN 12  CREATININE 0.81  CALCIUM 9.0   No results for input(s): AST, ALT, ALKPHOS, BILITOT, PROT, ALBUMIN in the last 72 hours.  Recent Labs  12/16/13 0153  WBC 8.3  HGB 11.0*  HCT 34.1*  MCV 86.3  PLT 190   No results for input(s): CKTOTAL, CKMB, TROPONINI in the last 72 hours. Invalid input(s): POCBNP No results for input(s): HGBA1C in the last 72 hours.   Radiology/Studies:  Ct Abdomen Pelvis Wo Contrast  12/16/2013   CLINICAL DATA:  Retroperitoneal bleed. Low hemoglobin. Generalized weakness.  EXAM: CT ABDOMEN AND PELVIS WITHOUT CONTRAST  TECHNIQUE: Multidetector CT imaging of the abdomen and pelvis was performed following the standard protocol without IV contrast.  COMPARISON:  Chest CT 12/15/2013  FINDINGS: Lower chest:  Lung bases are clear.  No pericardial fluid.  Hepatobiliary: Non IV contrast images demonstrate no focal hepatic lesion. The gallbladder is normal.  Pancreas: Pancreas is normal. No ductal dilatation. No pancreatic inflammation.  Spleen: Normal spleen  Adrenals/urinary tract: Adrenal glands and kidneys are normal. Ureters and bladder  normal.  Small amount of gas in the bladder consistent with recent catheterization. The bladder is deformed along its right border by a moderate size retroperitoneal hematoma.  Stomach/Bowel: Stomach, small bowel, appendix and cecum normal. The colon and rectosigmoid colon are normal.  Vascular/Lymphatic: Abdominal or is normal caliber. No retroperitoneal periportal lymphadenopathy.  Within the right obturator space there is a rounded high-density fluid collection consistent with hematoma. This hematoma measures 6.6 x 3.7 cm in axial dimension and 7 cm in craniocaudad dimension. The hematoma extends from the  level of the pubic symphysis to the S1 vertebral body. Calculated volume estimation of hematoma equals 500 cc.  Reproductive: Post hysterectomy anatomy.  Musculoskeletal: No aggressive osseous lesion. Multiple levels of endplate degeneration.  IMPRESSION:  Retroperitoneal hematoma in the right obturator space does not extend outside of the pelvis. Estimated volume calculated at 500 cc.  Findings conveyed toWillie, floor nurseon 12/16/2013  at16:07.   Electronically Signed   By: Genevive BiStewart  Edmunds M.D.   On: 12/16/2013 16:07   X-ray Chest Pa And Lateral  12/15/2013   CLINICAL DATA:  Chest pain and shortness of breath on 12 December 2013  EXAM: CHEST  2 VIEW  COMPARISON:  CT scan of the chest of today's date  FINDINGS: The lung volumes are low. Interstitial markings are increased bilaterally. There is no alveolar infiltrate. The cardiopericardial silhouette is normal in size. The pulmonary vascularity is not engorged. There is tortuosity of the ascending and descending thoracic aorta. No significant pleural effusion is evident. There is no pneumothorax. The bony thorax is unremarkable.  IMPRESSION: Bilateral pulmonary hypoinflation with increased pulmonary interstitial markings. This may reflect mild interstitial edema of cardiac or noncardiac calls. There is no alveolar pneumonia.   Electronically Signed   By: David  [place]   On: 12/15/2013 07:35   Ct Head W Wo Contrast  12/15/2013   CLINICAL DATA:  Dizziness and fall December 12, 2013.  EXAM: CT HEAD WITHOUT AND WITH CONTRAST  TECHNIQUE: Contiguous axial images were obtained from the base of the skull through the vertex without and with intravenous contrast  CONTRAST:  80mL OMNIPAQUE IOHEXOL 300 MG/ML  SOLN  COMPARISON:  None.  FINDINGS: The ventricles and sulci are normal for age. No intraparenchymal hemorrhage, mass effect nor midline shift. Mild supratentorial white matter hypodensities are within normal range for patient's age and though non-specific  suggest sequelae of chronic small vessel ischemic disease. No acute large vascular territory infarcts. 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 No abnormal parenchymal nor extra-axial enhancement.  No abnormal extra-axial fluid collections. Basal cisterns are patent. Moderate calcific atherosclerosis of the carotid siphons.  No skull fracture. The included ocular globes and orbital contents are non-suspicious. The mastoid aircells and included paranasal sinuses are well-aerated. Cervical facet screws seen on localizer.  IMPRESSION: No acute intracranial process.  Status post LEFT suboccipital craniotomy with subjacent encephalomalacia. No suspicious intracranial enhancement.   Electronically Signed   By: Awilda Metroourtnay  Bloomer   On: 12/15/2013 03:42   Ct Chest W Contrast  12/15/2013   CLINICAL DATA:  Chest pain and shortness of Breath.  EXAM: CT CHEST WITH CONTRAST  TECHNIQUE: Multidetector CT imaging of the chest was performed during intravenous contrast administration.  CONTRAST:  80mL OMNIPAQUE IOHEXOL 300 MG/ML  SOLN  COMPARISON:  None.  FINDINGS: Chest wall: No supraclavicular or axillary mass or lymphadenopathy. There are left-sided thyroid lobe lesions. The largest measures 18 mm. Recommend correlation with ultrasound examination and consideration for biopsy. No  obvious breast masses. The bony thorax is intact. No destructive bone lesions or spinal canal compromise.  Mediastinum: The heart is normal in size. No pericardial effusion. No mediastinal or hilar mass or lymphadenopathy. Small scattered lymph nodes are noted. The aorta is normal in caliber. No dissection. The branch vessels are patent. The pulmonary arteries appear normal. The esophagus is unremarkable.  Lungs: There is dependent bibasilar atelectasis, right greater than left. No definite infiltrates, edema or effusions. No interstitial lung disease or bronchiectasis. The tracheobronchial tree is unremarkable.  There is a 5 mm right upper lobe pulmonary nodule on image number 27. This is an indeterminate finding which will require follow-up. No other pulmonary nodules are identified.  Upper abdomen:  No significant findings.  IMPRESSION: 1. Patchy bibasilar dependent atelectasis, right greater than left but no infiltrates, edema or effusions. 2. No mediastinal or hilar mass or adenopathy. 3. 5 mm left upper lobe pulmonary nodule. If the patient is at high risk for bronchogenic carcinoma, follow-up chest CT at 6-12 months is recommended. If the patient is at low risk for bronchogenic carcinoma, follow-up chest CT at 12 months is recommended. This recommendation follows the consensus statement: Guidelines for Management of Small Pulmonary Nodules Detected on CT Scans: A Statement from the Fleischner Society as published in Radiology 2005;237:395-400. 4. 18 mm left thyroid lobe lesion.  Recommend ultrasound followup.   Electronically Signed   By: Loralie ChampagneMark  Gallerani M.D.   On: 12/15/2013 08:00     Assessment and Plan  1. Coronary artery aneurysm  - Dr. Donata ClayVan Trigt feels patient will require surgery. Dr. Eldridge DaceVaranasi has reviewed films and feels percutaneous intervention is not an option.  - cath 11/18 2.5 x 3.5 cm aneurysm of the proximal left circumflex, [facility] 12/14/2013 EF 55-60%, no RWMA 2. HTN 3. Retroperitoneal hematoma 4. 5mm LUL nodule by CT 11/20 5. Left sided thyroid lobe lesions by CT 11/20 6. Diabetes mellitus A1C 7.7 uncontrolled 7. UTI  Awaiting final decision regarding surgery vs medical therapy. The patient tells me she is expecting discharge today. She can f/[place]. [place]Khan [place] along with CV[facility] if this is the case.  She will also need to f/u PCP for her DM, thyroid lesions and lung nodule - patient aware. At this time remains on aspirin, statin and BB. Blood pressure a little soft this AM - will ask nursing to recheck. May need to consider decreasing ACEI  vs BB.  Signed, Ronie Spiesayna Dunn PA-C   Attending Note:   The patient was seen and examined.  Agree with assessment and plan as noted above.  Changes made to the above note as needed.  Angiograms show an aneurism of the LCx extending into the LM.  No angina Possible DC today. She will follow up with Dr. Welton FlakesKhan in [place] and TCTS.   We will sign off.  Call for questions.   Vesta MixerPhilip J. Cobie Leidner, Montez HagemanJr., MD, [facility] 12/18/2013, 11:01 AM [address],  [address] Office [phone] Pager [phone]- [phone]

## 2013-12-18 NOTE — Plan of Care (Signed)
Problem: Phase I Progression Outcomes Goal: Initial discharge plan identified Outcome: Progressing Goal: Hemodynamically stable Outcome: Completed/Met Date Met:  12/18/13 Goal: Distal pulses equal to baseline Outcome: Completed/Met Date Met:  12/18/13  Problem: Phase II Progression Outcomes Goal: Discharge plan in place and appropriate Outcome: Completed/Met Date Met:  12/18/13

## 2013-12-18 NOTE — Progress Notes (Signed)
Utilization review completed.  

## 2013-12-18 NOTE — Discharge Summary (Signed)
Physician Discharge Summary       [address] [place]       [facility] [address]             [phone]    Patient ID: [facility] MRN: [id number] DOB/AGE: Jul 24, 1950 63 y.o.  Admit date: 12/14/2013 Discharge date: 12/18/2013  Admission Diagnoses: 1. History of CAD (s/p PCI with stent) 2. Coronary aneurysm of the proximal circumflex, extending into the left main 3. Extraperitoneal hematoma of the right groin 4. History of DM 5. History of hypertension 6. History of hyperlipidemia 7. History of COPD 8. History of seizure disorder 9. Gram negative UTI  Discharge Diagnoses:  1. History of CAD (s/p PCI with stent) 2. Coronary aneurysm of the proximal circumflex, extending into the left main 3. Extraperitoneal hematoma of the right groin 4. History of DM 5. History of hypertension 6. History of hyperlipidemia 7. History of COPD 8. History of seizure disorder 9. Gram negative UTI  Consultants: Dr. Eldridge Dace (cardiology)  History of Presenting Illness: This is a 63 year old Caucasian female who initially presented to [facility] with complaints of dizziness on 12/12/2013. According to medical records, she was staggering around, trying to old onto the door. The room was not spinning, but she felt "swimmy headed". She then proceeded to fall. She called her sitter who brought her her cane and she was able to get to the couch. EMS was called She then developed chest pain that lasted 10-15 minute. She described it as a tightness in the Fraz[facility] of her chest and stated it was a 7/10 (10 being the worse pain). She was given two Nitroglycerin, which relieved the chest pain. Associated with it, was shortness of breath, but no nausea or diaphoresis. Her past medical history includes coronary artery disease (s/p PCI with stent in [place] 14'), diabetes, hypertension, hyperlipidemia, seizure disorder, and COPD. No acute EKG changes. Cardiac enzymes have been  negative. Of note, she was found to have a UTI (>100,000 gram negative rods). A cardiology consultation was obtained with Dr. Welton Flakes. He recommended cardiac catheterization. This was done on 12/13/2013. Results showed and LVEF was normal at 60%. The patient developed pain and swelling in the lower abdominal area after the catheterization. A CT of the abdomen and pelvis without contrast was done. Results showed a 6.2x7.8 cm extraperitoneal hematoma in the right anterior space of Retzius with inferior components tracking along the right groin. Probable bladder wall thickening versus hematoma within or along the bladder wall adjacent to the foley. No rectus sheath hematoma. Other findings included lower lumbar spondylosis and DJD causing multilevel foraminal impingement, and subsegmental atelectasis of both lower lobes. Dr. Welton Flakes contacted Dr. Donata Clay. He has accepted the patient in transfer to [facility] for further evaluation and treatment. Currently, she does not have chest pain and vital signs are stable   Brief [facility] Course:  She has remained afebrile and hemodynamically stable. She was given Cipro for her UTI. She has had no chest pain or shortness of breath since being at [facility]. A cardiology consult was obtained with Dr. Eldridge Dace. Per Dr. Eldridge Dace, there are no percutaneous options for repair of the coronary aneurysm, as the aneurysm occurs at the bifurcation of the distal left main into the circumflex.  A discussion was had with Dr. Donata Clay the patient, and the family. It was decided that the patient would not undergo heart surgery. She will be continued on medical therapy. Per Dr. Zenaida Niece  Trigt, she will be started on Ranexa 500 mg bid upon discharge. As discussed with Dr. Donata ClayVan Trigt, she is felt stable for discharge today.  Latest Vital Signs: Blood pressure 144/83, pulse 68, temperature 98.4 F (36.9 C), temperature source Oral, resp. rate 15, height 5\' 4"  (1.626 m), weight 169 lb 15.6  oz (77.1 kg), SpO2 98 %.  Physical Exam: General appearance: alert, cooperative and no distress Heart: regular rate and rhythm Lungs: clear to auscultation bilaterally Abdomen: benign Extremities: no edema  Discharge Condition:Stable  Recent laboratory studies:  Lab Results  Component Value Date   WBC 8.3 12/16/2013   HGB 11.0* 12/16/2013   HCT 34.1* 12/16/2013   MCV 86.3 12/16/2013   PLT 190 12/16/2013   Lab Results  Component Value Date   NA 142 12/16/2013   K 4.2 12/16/2013   CL 106 12/16/2013   CO2 19 12/16/2013   CREATININE 0.81 12/16/2013   GLUCOSE 113* 12/16/2013      Diagnostic Studies: Ct Abdomen Pelvis Wo Contrast  12/16/2013   CLINICAL DATA:  Retroperitoneal bleed. Low hemoglobin. Generalized weakness.  EXAM: CT ABDOMEN AND PELVIS WITHOUT CONTRAST  TECHNIQUE: Multidetector CT imaging of the abdomen and pelvis was performed following the standard protocol without IV contrast.  COMPARISON:  Chest CT 12/15/2013  FINDINGS: Lower chest:  Lung bases are clear.  No pericardial fluid.  Hepatobiliary: Non IV contrast images demonstrate no focal hepatic lesion. The gallbladder is normal.  Pancreas: Pancreas is normal. No ductal dilatation. No pancreatic inflammation.  Spleen: Normal spleen  Adrenals/urinary tract: Adrenal glands and kidneys are normal. Ureters and bladder normal.  Small amount of gas in the bladder consistent with recent catheterization. The bladder is deformed along its right border by a moderate size retroperitoneal hematoma.  Stomach/Bowel: Stomach, small bowel, appendix and cecum normal. The colon and rectosigmoid colon are normal.  Vascular/Lymphatic: Abdominal or is normal caliber. No retroperitoneal periportal lymphadenopathy.  Within the right obturator space there is a rounded high-density fluid collection consistent with hematoma. This hematoma measures 6.6 x 3.7 cm in axial dimension and 7 cm in craniocaudad dimension. The hematoma extends from the level  of the pubic symphysis to the S1 vertebral body. Calculated volume estimation of hematoma equals 500 cc.  Reproductive: Post hysterectomy anatomy.  Musculoskeletal: No aggressive osseous lesion. Multiple levels of endplate degeneration.  IMPRESSION:  Retroperitoneal hematoma in the right obturator space does not extend outside of the pelvis. Estimated volume calculated at 500 cc.  Findings conveyed toWillie, floor nurseon 12/16/2013  at16:07.   Electronically Signed   By: Genevive BiStewart  Edmunds M.D.   On: 12/16/2013 16:07   X-ray Chest Pa And Lateral  12/15/2013   CLINICAL DATA:  Chest pain and shortness of breath on 12 December 2013  EXAM: CHEST  2 VIEW  COMPARISON:  CT scan of the chest of today's date  FINDINGS: The lung volumes are low. Interstitial markings are increased bilaterally. There is no alveolar infiltrate. The cardiopericardial silhouette is normal in size. The pulmonary vascularity is not engorged. There is tortuosity of the ascending and descending thoracic aorta. No significant pleural effusion is evident. There is no pneumothorax. The bony thorax is unremarkable.  IMPRESSION: Bilateral pulmonary hypoinflation with increased pulmonary interstitial markings. This may reflect mild interstitial edema of cardiac or noncardiac calls. There is no alveolar pneumonia.   Electronically Signed   By: David  SwazilandJordan   On: 12/15/2013 07:35   Ct Head W Wo Contrast  12/15/2013   CLINICAL DATA:  Dizziness and fall December 12, 2013.  EXAM: CT HEAD WITHOUT AND WITH CONTRAST  TECHNIQUE: Contiguous axial images were obtained from the base of the skull through the vertex without and with intravenous contrast  CONTRAST:  80mL OMNIPAQUE IOHEXOL 300 MG/ML  SOLN  COMPARISON:  None.  FINDINGS: The ventricles and sulci are normal for age. No intraparenchymal hemorrhage, mass effect nor midline shift. Mild supratentorial white matter hypodensities are within normal range for patient's age and though non-specific suggest  sequelae of chronic small vessel ischemic disease. No acute large vascular territory infarcts. 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 No abnormal parenchymal nor extra-axial enhancement.  No abnormal extra-axial fluid collections. Basal cisterns are patent. Moderate calcific atherosclerosis of the carotid siphons.  No skull fracture. The included ocular globes and orbital contents are non-suspicious. The mastoid aircells and included paranasal sinuses are well-aerated. Cervical facet screws seen on localizer.  IMPRESSION: No acute intracranial process.  Status post LEFT suboccipital craniotomy with subjacent encephalomalacia. No suspicious intracranial enhancement.   Electronically Signed   By: Awilda Metro   On: 12/15/2013 03:42   Ct Chest W Contrast  12/15/2013   CLINICAL DATA:  Chest pain and shortness of Breath.  EXAM: CT CHEST WITH CONTRAST  TECHNIQUE: Multidetector CT imaging of the chest was performed during intravenous contrast administration.  CONTRAST:  80mL OMNIPAQUE IOHEXOL 300 MG/ML  SOLN  COMPARISON:  None.  FINDINGS: Chest wall: No supraclavicular or axillary mass or lymphadenopathy. There are left-sided thyroid lobe lesions. The largest measures 18 mm. Recommend correlation with ultrasound examination and consideration for biopsy. No obvious breast masses. The bony thorax is intact. No destructive bone lesions or spinal canal compromise.  Mediastinum: The heart is normal in size. No pericardial effusion. No mediastinal or hilar mass or lymphadenopathy. Small scattered lymph nodes are noted. The aorta is normal in caliber. No dissection. The branch vessels are patent. The pulmonary arteries appear normal. The esophagus is unremarkable.  Lungs: There is dependent bibasilar atelectasis, right greater than left. No definite infiltrates, edema or effusions. No interstitial lung disease or bronchiectasis. The tracheobronchial tree is unremarkable. There is a 5  mm right upper lobe pulmonary nodule on image number 27. This is an indeterminate finding which will require follow-up. No other pulmonary nodules are identified.  Upper abdomen:  No significant findings.  IMPRESSION: 1. Patchy bibasilar dependent atelectasis, right greater than left but no infiltrates, edema or effusions. 2. No mediastinal or hilar mass or adenopathy. 3. 5 mm left upper lobe pulmonary nodule. If the patient is at high risk for bronchogenic carcinoma, follow-up chest CT at 6-12 months is recommended. If the patient is at low risk for bronchogenic carcinoma, follow-up chest CT at 12 months is recommended. This recommendation follows the consensus statement: Guidelines for Management of Small Pulmonary Nodules Detected on CT Scans: A Statement from the Fleischner Society as published in Radiology 2005;237:395-400. 4. 18 mm left thyroid lobe lesion.  Recommend ultrasound followup.   Electronically Signed   By: Loralie Champagne M.D.   On: 12/15/2013 08:00     Discharge Medications:   Medication List    STOP taking these medications        amLODipine 5 MG tablet  Commonly known as:  NORVASC     clopidogrel 75 MG tablet  Commonly known as:  PLAVIX     nitroGLYCERIN 0.4 MG SL tablet  Commonly known as:  NITROSTAT      TAKE these medications  albuterol 108 (90 BASE) MCG/ACT inhaler  Commonly known as:  PROVENTIL HFA;VENTOLIN HFA  Inhale 2 puffs into the lungs every 6 (six) hours as needed for wheezing or shortness of breath.     APTIOM 200 MG Tabs  Generic drug:  Eslicarbazepine Acetate  Take 200 mg by mouth daily.     aspirin EC 81 MG tablet  Take 81 mg by mouth daily.     docusate sodium 100 MG capsule  Commonly known as:  COLACE  Take 100 mg by mouth at bedtime.     fluticasone 50 MCG/ACT nasal spray  Commonly known as:  FLONASE  Place 2 sprays into both nostrils daily.     Fluticasone-Salmeterol 500-50 MCG/DOSE Aepb  Commonly known as:  ADVAIR  Inhale 1  puff into the lungs 2 (two) times daily.     gabapentin 300 MG capsule  Commonly known as:  NEURONTIN  Take 300-600 mg by mouth 3 (three) times daily. 1 capsule twice daily and 2 capsules at bedtime     glimepiride 2 MG tablet  Commonly known as:  AMARYL  Take 2 mg by mouth daily with breakfast.     isosorbide mononitrate 30 MG 24 hr tablet  Commonly known as:  IMDUR  Take 30 mg by mouth daily.     lacosamide 200 MG Tabs tablet  Commonly known as:  VIMPAT  Take 200 mg by mouth 2 (two) times daily.     lisinopril 20 MG tablet  Commonly known as:  PRINIVIL,ZESTRIL  Take 20 mg by mouth daily.     metoprolol succinate 25 MG 24 hr tablet  Commonly known as:  TOPROL-XL  Take 1 tablet (25 mg total) by mouth daily.     montelukast 10 MG tablet  Commonly known as:  SINGULAIR  Take 10 mg by mouth at bedtime.     oyster calcium 500 MG Tabs tablet  Take 500 mg of elemental calcium by mouth daily.     pantoprazole 40 MG tablet  Commonly known as:  PROTONIX  Take 40 mg by mouth daily.     potassium chloride SA 20 MEQ tablet  Commonly known as:  K-DUR,KLOR-CON  Take 20 mEq by mouth daily.     ranitidine 150 MG tablet  Commonly known as:  ZANTAC  Take 150 mg by mouth at bedtime.     ranolazine 500 MG 12 hr tablet  Commonly known as:  RANEXA  Take 1 tablet (500 mg total) by mouth 2 (two) times daily.     simvastatin 5 MG tablet  Commonly known as:  ZOCOR  Take 5 mg by mouth daily.     solifenacin 10 MG tablet  Commonly known as:  VESICARE  Take 10 mg by mouth daily.       The patient has been discharged on:   1.Beta Blocker:  Yes [ x  ]                              No   [   ]                              If No, reason:  2.Ace Inhibitor/ARB: Yes [ x  ]  No  [    ]                                     If No, reason:  3.Statin:   Yes [  x ]                  No  [   ]                  If No, reason:  4.Ecasa:  Yes  [  x ]                   No   [   ]                  If No, reason:  Follow Up Appointments:     Follow-up Information    Follow up with Primary Care Doctor.   Why:  You need to follow up with primary doctor for your diabetes, lung nodule seen on CT, and thyroid abnormality seen on CT.      Follow up with Laurier NancyKHAN,SHAUKAT A, MD.   Specialty:  Cardiology   Why:  Call for an appointment for 2 weeks   Contact information:   [address] [place] [address] [phone]       Follow up with VAN Dinah BeersRIGT III,PETER, MD On 01/10/2014.   Specialty:  Cardiothoracic Surgery   Why:  Appointment time is at 4:00 pm   Contact information:   [address] [place] [address] [phone]       Follow up with Pcp Not In System.   Why:  She needs to obtain a medical doctor to follow her HGA1C 7.7 and further diabetes management      Signed: Shiree Altemus MPA-C 12/18/2013, 3:40 PM

## 2013-12-18 NOTE — Discharge Instructions (Signed)
Coronary Artery Disease  Coronary artery disease (CAD) is a process in which the heart (coronary) arteries narrow or become blocked from the development of atherosclerosis. Atherosclerosis is a disease in which plaque builds up on the inside of the heart arteries (coronary arteries). Plaque is made up of fats (lipids), cholesterol, calcium, and fibrous tissue. CAD can lead to a heart attack (myocardial infarction, MI). An MI can lead to heart failure, cardiogenic shock, or sudden cardiac death. CAD can cause an MI through:  · Plaque buildup that can severely narrow or block the coronary arteries and diminish blood flow.  · Plaque that can become unstable and "rupture." Unstable plaque that ruptures within a coronary artery can form a clot and cause a sudden (acute) blockage.  RISK FACTORS  Many risk factors contribute to the development of CAD. These include:  · High cholesterol (dyslipidemia) levels.  · High blood pressure (hypertension).  · Smoking.  · Diabetes.  · Age.  · Gender. Men can develop CAD earlier in life than women.  · Family history.  · Inactivity or lack of regular physical or aerobic exercise.  · A diet high in saturated fats.  · Chronic kidney disease.  SYMPTOMS   When a coronary artery is narrowed or blocked, an MI can occur. MI symptoms can include:  · Chest pain (agina). Angina can occur by itself or it can also occur with pain in the neck, arm, jaw, or in the upper, middle back (mid-scapular pain).  · Profuse sweating (diaphoresis) without physical activity or movement.  · Shortness of breath (dyspnea).  · Irregular heartbeats (palpitations) that feel like your heart is skipping beats or is beating very fast.  · Nausea.  · Epigastric pain. Epigastric pain may occur as "heartburn."  · Tiredness (malaise). This can especially be present in the elderly.  Women can have different (atypical) symptoms other than classic angina.   DIAGNOSIS   The diagnosis of CAD may include:  · An electrocardiography  (ECG). An ECG does not diagnose CAD, but it is usefull in the detection of a sudden (acute) MI or as a marker for a previous MI. Depending on which heart (coronary) artery may be blocked, an ECG may not pick up an MI pattern.  · Exercise stress test. A stress test can be performed at rest for people who are unable to do an exercise stress test. A stress test will only be abnormal if one or more of the large coronary arteries is significantly blocked.  · Blood tests. Tests may include samples to detect heart muscle damage (such as troponin levels). Other tests may include cholesterol checks and an inflammation test (high-sensitivity C-reactive protein, hs-CRP).  · Coronary angiography.  · Screening people who have peripheral vascular disease (PAD). These people often times have CAD.  TREATMENT   The treatment of CAD includes the following:  · Lifestyle changes such as:  ¨ Following a heart-healthy diet. A registered dietitian can you help educate you on healthy food options and changes.  ¨ Quiting smoking.  ¨ Following an exercise program approved by your caregiver.  ¨ Maintaining a healthy weight. Lose weight as approved by your caregiver.  · Medicines to help control your blood pressure, cholesterol level, angina, and blood clotting. Medicines may include beta-blockers, ACE inhibitors, statins, nitrates, and anti-platelet medicines.  ¨ If you have a heart stent and are taking anti-platelet medicine, it is important to not suddenly stop taking this medicine. Suddenly stopping anti-platelet medicine can result in   an MI. Talk with your caregiver before stopping medicine or if you cannot afford your medicine.  · If the coronary arteries are significantly blocked, surgery may be needed. This can include:  ¨ Percutaneous coronary intervention (PCI) with or without stent placement.  ¨ Coronary artery bypass graft surgery (CABG).  SEEK IMMEDIATE MEDICAL CARE IF:   · You develop MI symptoms. This is a medical emergency. Get  help at once. Call your local emergency service (911 in the [place] immediately. Do not drive yourself to the clinic or hospital. MI symptoms can include:  ¨ Angina or pain that occurs in the neck, arm, jaw, or in the upper middle back.  ¨ Profuse sweating without cause.  ¨ Shortness of breath or difficulty breathing without cause.  ¨ Unexplained nausea or epigastric pain that feels like heartburn.  Document Released: 04/06/2011 Document Reviewed: 05/16/2013  ExitCare® Patient Information ©2015 [employer]. This information is not intended to replace advice given to you by your health care provider. Make sure you discuss any questions you have with your health care provider.

## 2013-12-18 NOTE — Progress Notes (Signed)
[address]       [place] [id number]             [phone]        Procedure(s) (LRB): CORONARY ARTERY BYPASS GRAFTING (CABG) (N/A) TRANSESOPHAGEAL ECHOCARDIOGRAM (TEE) (N/A) Subjective: renains cllinically stable  Objective: Vital signs in last 24 hours: Temp:  [98.1 F (36.7 C)-98.6 F (37 C)] 98.1 F (36.7 C) (11/23 0403) Pulse Rate:  [68-81] 68 (11/23 0403) Cardiac Rhythm:  [-] Normal sinus rhythm (11/22 0826) Resp:  [15-16] 15 (11/23 0403) BP: (101-119)/(56-72) 101/56 mmHg (11/23 0403) SpO2:  [98 %-100 %] 98 % (11/23 0403) Weight:  [169 lb 15.6 oz (77.1 kg)] 169 lb 15.6 oz (77.1 kg) (11/23 0403)  Hemodynamic parameters for last 24 hours:    Intake/Output from previous day: 11/22 0701 - 11/23 0700 In: 486 [P.O.:480; I.V.:6] Out: -  Intake/Output this shift:    General appearance: alert, cooperative and no distress Heart: regular rate and rhythm Lungs: clear to auscultation bilaterally Abdomen: benign Extremities: no edema  Lab Results:  Recent Labs  12/16/13 0153  WBC 8.3  HGB 11.0*  HCT 34.1*  PLT 190   BMET:  Recent Labs  12/16/13 0153  NA 142  K 4.2  CL 106  CO2 19  GLUCOSE 113*  BUN 12  CREATININE 0.81  CALCIUM 9.0    PT/INR: No results for input(s): LABPROT, INR in the last 72 hours. ABG    Component Value Date/Time   PHART 7.445 12/14/2013 1455   HCO3 24.1* 12/14/2013 1455   TCO2 25.2 12/14/2013 1455   O2SAT 93.2 12/14/2013 1455   CBG (last 3)   Recent Labs  12/17/13 1639 12/17/13 2108 12/18/13 0609  GLUCAP 88 220* 129*    Meds Scheduled Meds: . aspirin EC  81 mg Oral Daily  . ciprofloxacin  500 mg Oral BID  . darifenacin  7.5 mg Oral Daily  . docusate sodium  100 mg Oral BID  . Eslicarbazepine Acetate  200 mg Oral q morning - 10a  . gabapentin  300 mg Oral BID  . glimepiride  2 mg Oral Q breakfast  . insulin aspart  0-5 Units Subcutaneous QHS  . insulin aspart  4 Units Subcutaneous TID  WC  . isosorbide mononitrate  30 mg Oral Daily  . lacosamide  200 mg Oral BID  . lisinopril  20 mg Oral Daily  . metoprolol succinate  25 mg Oral Daily  . mometasone-formoterol  2 puff Inhalation BID  . montelukast  10 mg Oral QHS  . pantoprazole  40 mg Oral Q1200  . ranolazine  500 mg Oral BID  . simvastatin  5 mg Oral q1800  . sodium chloride  3 mL Intravenous Q12H  . sodium chloride  3 mL Intravenous Q12H   Continuous Infusions:  PRN Meds:.sodium chloride, acetaminophen **OR** acetaminophen, albuterol, alum & mag hydroxide-simeth, HYDROcodone-acetaminophen, sodium chloride, sorbitol, zolpidem  Xrays Ct Abdomen Pelvis Wo Contrast  12/16/2013   CLINICAL DATA:  Retroperitoneal bleed. Low hemoglobin. Generalized weakness.  EXAM: CT ABDOMEN AND PELVIS WITHOUT CONTRAST  TECHNIQUE: Multidetector CT imaging of the abdomen and pelvis was performed following the standard protocol without IV contrast.  COMPARISON:  Chest CT 12/15/2013  FINDINGS: Lower chest:  Lung bases are clear.  No pericardial fluid.  Hepatobiliary: Non IV contrast images demonstrate no focal hepatic lesion. The gallbladder is normal.  Pancreas: Pancreas is normal. No ductal dilatation. No pancreatic inflammation.  Spleen: Normal  spleen  Adrenals/urinary tract: Adrenal glands and kidneys are normal. Ureters and bladder normal.  Small amount of gas in the bladder consistent with recent catheterization. The bladder is deformed along its right border by a moderate size retroperitoneal hematoma.  Stomach/Bowel: Stomach, small bowel, appendix and cecum normal. The colon and rectosigmoid colon are normal.  Vascular/Lymphatic: Abdominal or is normal caliber. No retroperitoneal periportal lymphadenopathy.  Within the right obturator space there is a rounded high-density fluid collection consistent with hematoma. This hematoma measures 6.6 x 3.7 cm in axial dimension and 7 cm in craniocaudad dimension. The hematoma extends from the level of the  pubic symphysis to the S1 vertebral body. Calculated volume estimation of hematoma equals 500 cc.  Reproductive: Post hysterectomy anatomy.  Musculoskeletal: No aggressive osseous lesion. Multiple levels of endplate degeneration.  IMPRESSION:  Retroperitoneal hematoma in the right obturator space does not extend outside of the pelvis. Estimated volume calculated at 500 cc.  Findings conveyed toWillie, floor nurseon 12/16/2013  at16:07.   Electronically Signed   By: Genevive BiStewart  Edmunds M.D.   On: 12/16/2013 16:07    Assessment/Plan: S/P Procedure(s) (LRB): CORONARY ARTERY BYPASS GRAFTING (CABG) (N/A) TRANSESOPHAGEAL ECHOCARDIOGRAM (TEE) (N/A)   1 remains clinically stable 2 awaits final decisions on timing of surgery   LOS: 4 days    Latoyna Hird E 12/18/2013

## 2013-12-22 ENCOUNTER — Emergency Department: Payer: Self-pay | Admitting: Emergency Medicine

## 2013-12-22 LAB — BASIC METABOLIC PANEL
Anion Gap: 2 — ABNORMAL LOW (ref 7–16)
BUN: 14 mg/dL (ref 7–18)
CREATININE: 0.86 mg/dL (ref 0.60–1.30)
Calcium, Total: 9.1 mg/dL (ref 8.5–10.1)
Chloride: 107 mmol/L (ref 98–107)
Co2: 31 mmol/L (ref 21–32)
EGFR (African American): >60
EGFR (Non-African Amer.): >60
Glucose: 142 mg/dL — ABNORMAL HIGH (ref 65–99)
OSMOLALITY: 282 (ref 275–301)
Potassium: 4.3 mmol/L (ref 3.5–5.1)
SODIUM: 140 mmol/L (ref 136–145)

## 2013-12-22 LAB — TROPONIN I: Troponin-I: <0.02 ng/mL

## 2013-12-22 LAB — CBC WITH DIFFERENTIAL/PLATELET
BASOS PCT: 0.7 %
Basophil #: 0.1 10*3/uL (ref 0.0–0.1)
Eosinophil #: 0.1 10*3/uL (ref 0.0–0.7)
Eosinophil %: 0.8 %
HCT: 36.4 % (ref 35.0–47.0)
HGB: 11.6 g/dL — ABNORMAL LOW (ref 12.0–16.0)
Lymphocyte #: 2 10*3/uL (ref 1.0–3.6)
Lymphocyte %: 20.6 %
MCH: 27.6 pg (ref 26.0–34.0)
MCHC: 31.8 g/dL — ABNORMAL LOW (ref 32.0–36.0)
MCV: 87 fL (ref 80–100)
Monocyte #: 0.8 x10 3/mm (ref 0.2–0.9)
Monocyte %: 8.6 %
NEUTROS ABS: 6.7 10*3/uL — AB (ref 1.4–6.5)
NEUTROS PCT: 69.3 %
Platelet: 246 10*3/uL (ref 150–440)
RBC: 4.21 10*6/uL (ref 3.80–5.20)
RDW: 15.1 % — AB (ref 11.5–14.5)
WBC: 9.7 10*3/uL (ref 3.6–11.0)

## 2014-01-01 ENCOUNTER — Telehealth: Payer: Self-pay | Admitting: *Deleted

## 2014-01-10 ENCOUNTER — Encounter: Payer: Medicare Other | Admitting: Cardiothoracic Surgery

## 2014-01-10 ENCOUNTER — Ambulatory Visit: Payer: Medicare Other | Admitting: Cardiothoracic Surgery

## 2014-01-16 ENCOUNTER — Other Ambulatory Visit: Payer: Self-pay | Admitting: Physician Assistant

## 2014-01-16 ENCOUNTER — Encounter: Payer: Medicare Other | Admitting: Thoracic Surgery (Cardiothoracic Vascular Surgery)

## 2014-01-22 ENCOUNTER — Other Ambulatory Visit: Payer: Self-pay | Admitting: Physician Assistant

## 2014-02-27 ENCOUNTER — Other Ambulatory Visit: Payer: Self-pay | Admitting: Physician Assistant

## 2014-03-29 ENCOUNTER — Other Ambulatory Visit: Payer: Self-pay | Admitting: Physician Assistant
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
## 2014-05-19 NOTE — Consult Note (Signed)
PATIENT NAME:  Krista Frazier, Krista Frazier MR#:  [id number] DATE OF BIRTH:  06/22/1950  DATE OF CONSULTATION:  12/12/2013  REFERRING PHYSICIAN:   CONSULTING PHYSICIAN:  Laurier NancyShaukat A. Maurilio Puryear, MD  INDICATION FOR CONSULTATION: Chest pain.   HISTORY OF PRESENT ILLNESS: This is a 64 year old African American female with a past medical history of hypertension, hyperlipidemia, coronary artery disease, diabetes mellitus, status [facility], who presented to the Emergency Room with chest pain associated with shortness of breath and diaphoresis. First set of cardiac enzymes is negative. The patient apparently was doing fine, all of a sudden developed left-sided pressure-type chest pain, took 2 nitroglycerin without relief, thus presented to the Emergency Room.   PAST MEDICAL HISTORY: Diabetes, hypertension, hyperlipidemia, coronary artery disease, status post PCI at [facility].   SOCIAL HISTORY: No EtOH abuse or smoking.   FAMILY HISTORY: Positive for coronary artery disease.   MEDICATIONS: Amlodipine 5 mg, aspirin 81 mg, clopidogrel 75 mg, gabapentin, lisinopril potassium.   PHYSICAL EXAMINATION:  GENERAL: She is alert, oriented x 3, in no acute distress right now.  VITAL SIGNS: Stable.  NECK: Revealed no JVD.  LUNGS: Clear.  HEART: Regular rate and rhythm. Normal S1, S2. No audible murmur.  ABDOMEN: Soft, nontender, positive bowel sounds.  EXTREMITIES: No pedal edema.  NEUROLOGIC: She appears to be intact.   LABORATORY AND IMAGING DATA:   EKG shows sinus rhythm. No acute changes. First set of cardiac enzymes is negative. White count is normal. She does have 3+ bacteria in the urine. Chest x-ray is negative.   ASSESSMENT AND PLAN: Chest pain with history of coronary artery disease, status post PCI and [facility]. First set of cardiac enzymes is negative. No acute EKG changes. Also has urinary tract infection for which the patient is getting blood  cultures and urine culture and going to be put on antibiotics. She had a dizzy spell with it associated with chest pain. I would advise ruling out myocardial infarction, may need at some point further workup; we will decide based after 3 sets of cardiac enzymes. Right now does not appear to be having any chest pain.    ____________________________ Laurier NancyShaukat A. Lenaya Pietsch, MD sak:TT D: 12/12/2013 14:28:27 ET T: 12/12/2013 14:38:28 ET JOB#: [id number]  cc: Laurier NancyShaukat A. Perley Arthurs, MD, <Dictator> Laurier NancySHAUKAT A Masae Lukacs MD ELECTRONICALLY SIGNED 12/28/2013 15:38

## 2014-05-19 NOTE — Discharge Summary (Signed)
PATIENT NAME:  Krista Frazier, Krista Frazier MR#:  [id number] DATE OF BIRTH:  January 17, 1951  DATE OF ADMISSION:  12/12/2013 DATE OF DISCHARGE:  12/14/2013  DISPOSITION: Krista Frazier  REASON FOR TRANSFER: For CABG secondary to left circumflex aneurysm.   CONSULTATIONS: Cardiology with Dr. Adrian BlackwaterShaukat Khan.  DISCHARGE DIAGNOSES: 1.  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  2.  Retroperitoneal hematoma.  3.  Acute cystitis.  4.  History of seizure disorder.  5.  Anxiety.  6.  Type 2 diabetes mellitus.  7.  Hyperlipidemia.  8.  Hypertension.  9.  Chronic obstructive pulmonary disease.   HOSPITAL COURSE: A 64 year old female patient brought in because of chest pain and dizziness. The patient was given nitro, admitted to hospitalist service for chest pain. The patient'Frazier chest x-ray did not show any acute changes. Urine was positive for nitrites and leukocyte esterase. EKG did not show any acute ST-T changes. Started on her aspirin and Plavix that she was taking at home and we continued the statins, ACE inhibitors and beta blockers. Seen by cardiology, Dr. Adrian BlackwaterShaukat Khan. Troponins have been negative x2. The patient'Frazier LDL was 49. She had a cardiac cath done on the 18th and angiogram showed EF of 60% and there is an aneurysm in the left circumflex extending into the left main at 2.5 to 3.5 cm. The patient needs to have CABG at Krist[facility] Frazier. The patient developed retroperitoneal hematoma after the cath and she had like a 9.1 x 6.2 x 7.8 cm hematoma displacing the renal bladder to the left side and it was in the right lower anterior pelvis. The patient was seen by vascular, from Dr. Gilda CreaseSchnier. He recommended monitoring in the ICU closely, so we will transferred the patient to the ICU. The patient is hemodynamically stable and we have stopped the aspirin and Plavix to prevent further hematoma. Her hemoglobin was 12.3 yesterday and it was 11.1 today. The patient'Frazier blood pressure is also stable. Blood  pressure is slightly high because she is very anxious. BP is 113/100, pulse 77. The patient is receiving some Xanax for anxiety and we are giving her her morning blood pressure medications at this time. The patient noted to have cystitis. Urine culture showed gram-negative rods. Complete cultures are pending, but she is on Keflex 250 mg q. 8 hours.   DISCHARGE MEDICATIONS: 1.  Metoprolol 25 mg p.o. b.i.d.  2.  Keflex 250 mg q. 8 hours. 3.  VESIcare 10 mg p.o. daily. 4.  Simvastatin 5 mg p.o. daily.  5.  Zantac 150 mg at bedtime.  6.  Lisinopril 20 mg p.o. daily. 7.  Montelukast 10 mg p.o. daily. 8.  Imdur 30 mg p.o. daily. 9.  Vimpat 200 mg in the morning and also in the evening.  10.  Advair Diskus 500/50 one puff b.i.d.  11.  Flonase nasal spray.   [facility].  TIME SPENT ON DISCHARGE SUMMARY: More than 30 minutes.  ____________________________ Katha HammingSnehalatha Delbert Darley, MD sk:sb D: 12/14/2013 08:52:18 ET T: 12/14/2013 09:59:38 ET JOB#: [id number]  cc: Katha HammingSnehalatha Thunder Bridgewater, MD, <Dictator> Laurier NancyShaukat A. Khan, MD Katha HammingSNEHALATHA Ronique Simerly MD ELECTRONICALLY SIGNED 12/25/2013 18:44

## 2014-05-19 NOTE — Consult Note (Signed)
Brief Consult Note: Diagnosis: retroperitoneal hematoma following cardiac catheterization.   Patient was seen by consultant.   Recommend further assessment or treatment.   Comments: patient with pain following cath and hypotension.  CT scan reveals hematoma of the right pelvis in the retroperitoneal space.  At the time I ecamined the patient she was hemodynamically stable with BP 114/60 and HR 64 groin is soft 2+ DP pulse right foot I personally reviewed the CT scan  No emergent surgery at this time Transfer to CCU and follow Hgb and hemodynamics continue foley as the hematoma is compressing the bladder If stable through the night then agree with transfer for further cardiac care.  Electronic Signatures: Levora DredgeSchnier, Gregory (MD)  (Signed [phone]-Nov-15 23:16)  Authored: Brief Consult Note   Last Updated: 18-Nov-15 23:16 by Levora DredgeSchnier, Gregory (MD)

## 2014-05-30 ENCOUNTER — Other Ambulatory Visit: Payer: Self-pay | Admitting: Physician Assistant

## 2014-06-27 ENCOUNTER — Other Ambulatory Visit: Payer: Self-pay | Admitting: Physician Assistant

## 2015-07-08 ENCOUNTER — Emergency Department: Payer: Medicare Other

## 2015-07-08 ENCOUNTER — Emergency Department
Admission: EM | Admit: 2015-07-08 | Discharge: 2015-07-09 | Discharge status: Against Medical Advice | Payer: Medicare Other | Attending: Emergency Medicine | Admitting: Emergency Medicine

## 2015-07-08 DIAGNOSIS — Z8669 Personal history of other diseases of the nervous system and sense organs: Secondary | ICD-10-CM | POA: Insufficient documentation

## 2015-07-08 DIAGNOSIS — I10 Essential (primary) hypertension: Secondary | ICD-10-CM | POA: Insufficient documentation

## 2015-07-08 DIAGNOSIS — Z7982 Long term (current) use of aspirin: Secondary | ICD-10-CM | POA: Insufficient documentation

## 2015-07-08 DIAGNOSIS — M79661 Pain in right lower leg: Secondary | ICD-10-CM | POA: Insufficient documentation

## 2015-07-08 DIAGNOSIS — Z79899 Other long term (current) drug therapy: Secondary | ICD-10-CM | POA: Diagnosis not present

## 2015-07-08 DIAGNOSIS — M1990 Unspecified osteoarthritis, unspecified site: Secondary | ICD-10-CM | POA: Diagnosis not present

## 2015-07-08 DIAGNOSIS — Z8673 Personal history of transient ischemic attack (TIA), and cerebral infarction without residual deficits: Secondary | ICD-10-CM | POA: Insufficient documentation

## 2015-07-08 DIAGNOSIS — W19XXXA Unspecified fall, initial encounter: Secondary | ICD-10-CM | POA: Diagnosis not present

## 2015-07-08 DIAGNOSIS — Z7951 Long term (current) use of inhaled steroids: Secondary | ICD-10-CM | POA: Insufficient documentation

## 2015-07-08 DIAGNOSIS — J449 Chronic obstructive pulmonary disease, unspecified: Secondary | ICD-10-CM | POA: Insufficient documentation

## 2015-07-08 DIAGNOSIS — I2510 Atherosclerotic heart disease of native coronary artery without angina pectoris: Secondary | ICD-10-CM | POA: Diagnosis not present

## 2015-07-08 DIAGNOSIS — E119 Type 2 diabetes mellitus without complications: Secondary | ICD-10-CM | POA: Insufficient documentation

## 2015-07-08 DIAGNOSIS — M79604 Pain in right leg: Secondary | ICD-10-CM

## 2015-07-08 DIAGNOSIS — R5383 Other fatigue: Secondary | ICD-10-CM | POA: Diagnosis present

## 2015-07-08 LAB — CBC
HCT: 37.4 % (ref 35.0–47.0)
HEMOGLOBIN: 12.2 g/dL (ref 12.0–16.0)
MCH: 29 pg (ref 26.0–34.0)
MCHC: 32.7 g/dL (ref 32.0–36.0)
MCV: 88.8 fL (ref 80.0–100.0)
Platelets: 171 10*3/uL (ref 150–440)
RBC: 4.21 MIL/uL (ref 3.80–5.20)
RDW: 14.7 % — ABNORMAL HIGH (ref 11.5–14.5)
WBC: 8.5 10*3/uL (ref 3.6–11.0)

## 2015-07-08 LAB — BASIC METABOLIC PANEL
ANION GAP: 5 (ref 5–15)
BUN: 18 mg/dL (ref 6–20)
CHLORIDE: 108 mmol/L (ref 101–111)
CO2: 26 mmol/L (ref 22–32)
Calcium: 9.3 mg/dL (ref 8.9–10.3)
Creatinine, Ser: 1.03 mg/dL — ABNORMAL HIGH (ref 0.44–1.00)
GFR calc non Af Amer: 55 mL/min — ABNORMAL LOW (ref >60)
Glucose, Bld: 163 mg/dL — ABNORMAL HIGH (ref 65–99)
Potassium: 4.1 mmol/L (ref 3.5–5.1)
Sodium: 139 mmol/L (ref 135–145)

## 2015-07-08 LAB — URINALYSIS COMPLETE WITH MICROSCOPIC (ARMC ONLY)
Bilirubin Urine: NEGATIVE
Glucose, UA: NEGATIVE mg/dL
Hgb urine dipstick: NEGATIVE
Ketones, ur: NEGATIVE mg/dL
Leukocytes, UA: NEGATIVE
Nitrite: POSITIVE — AB
PH: 6 (ref 5.0–8.0)
PROTEIN: NEGATIVE mg/dL
Specific Gravity, Urine: 1.011 (ref 1.005–1.030)

## 2015-07-08 LAB — PROTIME-INR
INR: 2.35
PROTHROMBIN TIME: 25.5 s — AB (ref 11.4–15.0)

## 2015-07-08 LAB — APTT: aPTT: 40 seconds — ABNORMAL HIGH (ref 24–36)

## 2015-07-08 MED ORDER — ACETAMINOPHEN 500 MG PO TABS
1000.0000 mg | ORAL_TABLET | Freq: Once | ORAL | Status: AC
  Administered 2015-07-09: 1000 mg via ORAL
  Filled 2015-07-08: qty 2

## 2015-07-08 NOTE — ED Notes (Signed)
Pt c/o increased generalized weakness over the past month with multiple falls, states she fell last night.. Pt is pitting edema in BL LE..Marland Kitchen

## 2015-07-09 ENCOUNTER — Emergency Department: Payer: Medicare Other

## 2015-07-09 LAB — BRAIN NATRIURETIC PEPTIDE: B Natriuretic Peptide: 31 pg/mL (ref 0.0–100.0)

## 2015-07-09 NOTE — ED Notes (Signed)
Pt leaving AMA Dr Zenda AlpersWebster made aware

## 2015-07-09 NOTE — ED Provider Notes (Signed)
[facility] Emergency Department Provider Note   ____________________________________________  Time seen: Approximately 2345 PM  I have reviewed the triage vital signs and the nursing notes.   HISTORY  Chief Complaint Fatigue and Fall    HPI Krista Frazier is a 65 y.o. female comes into the hospital today not being able to move her right leg. The patient reports that last night she got up to use the bathroom and she fell. The patient's sisters noticed this morning that she couldn't move her leg. The report that when she walks she drives her leg. She reports that this is just been this morning. The patient has frequent falls and according to her sister she has been having frequent falls for multiple years. She reports that she is unaware why she is falling. She could not explain if it was that she lost her balance or if she was dizzy. The patient reports that her entire leg is hurting which is why she is unable to move it. The patient does seem to have some difficulty answering questions and looks to her sister's to help answer these questions. She reports that the pain in her leg as a 10 out of 10 in intensity. She is here for evaluation.Patient denies any chest pain, any headache, any blurry vision, any abdominal pain nausea or vomiting. The patient did not take anything for pain.   Past Medical History  Diagnosis Date  . Coronary artery disease   . Headache     "not too often" (12/15/2013)  . Stroke [facility])     "weak on left arm/leg since"  . Seizures (HCC)     "? kind" (12/15/2013)  . Arthritis     "arms and knees" (12/15/2013)  . Type II diabetes mellitus (HCC)   . COPD (chronic obstructive pulmonary disease) (HCC)     [name] 12/14/2013  . Hypertension     [name] 12/14/2013  . High cholesterol     /notes 12/14/2013    Patient Active Problem List   Diagnosis Date Noted  . Coronary artery disease involving native coronary artery of native heart  without angina pectoris   . Hx of aneurysm   . Chest pain 12/14/2013    Past Surgical History  Procedure Laterality Date  . Vaginal hysterectomy    . Back surgery    . Cervical disc surgery    . Foot surgery Right     "took a bone out"  . Coronary angioplasty with stent placement  2014    [name] 12/14/2013  . Cardiac catheterization  12/13/2013    [name] 12/14/2013  . Nasal septum surgery    . Carpal tunnel release Bilateral     Current Outpatient Rx  Name  Route  Sig  Dispense  Refill  . albuterol (PROVENTIL HFA;VENTOLIN HFA) 108 (90 BASE) MCG/ACT inhaler   Inhalation   Inhale 2 puffs into the lungs every 6 (six) hours as needed for wheezing or shortness of breath.         Marland Kitchen amLODipine (NORVASC) 5 MG tablet   Oral   Take 1 tablet by mouth daily.         Marland Kitchen aspirin EC 81 MG tablet   Oral   Take 81 mg by mouth daily.         . clopidogrel (PLAVIX) 75 MG tablet   Oral   Take 1 tablet by mouth daily.         Marland Kitchen docusate sodium (COLACE) 100 MG  capsule   Oral   Take 100 mg by mouth at bedtime.          . Eslicarbazepine Acetate (APTIOM) 200 MG TABS   Oral   Take 200 mg by mouth daily.         . fluticasone (FLONASE) 50 MCG/ACT nasal spray   Each Nare   Place 2 sprays into both nostrils daily.         . Fluticasone-Salmeterol (ADVAIR) 500-50 MCG/DOSE AEPB   Inhalation   Inhale 1 puff into the lungs 2 (two) times daily.         Marland Kitchen. gabapentin (NEURONTIN) 300 MG capsule   Oral   Take 300-600 mg by mouth 3 (three) times daily. 1 capsule twice daily and 2 capsules at bedtime         . glimepiride (AMARYL) 2 MG tablet   Oral   Take 2 mg by mouth daily with breakfast.         . isosorbide mononitrate (IMDUR) 30 MG 24 hr tablet   Oral   Take 30 mg by mouth daily.         Marland Kitchen. lacosamide (VIMPAT) 200 MG TABS tablet   Oral   Take 200 mg by mouth 2 (two) times daily.         Marland Kitchen. lisinopril (PRINIVIL,ZESTRIL) 20 MG tablet   Oral   Take 20 mg by  mouth daily.         . metoprolol succinate (TOPROL-XL) 25 MG 24 hr tablet   Oral   Take 1 tablet (25 mg total) by mouth daily.   30 tablet   1   . montelukast (SINGULAIR) 10 MG tablet   Oral   Take 10 mg by mouth at bedtime.         Ethelda Chick. Oyster Shell (OYSTER CALCIUM) 500 MG TABS tablet   Oral   Take 500 mg of elemental calcium by mouth daily.         . pantoprazole (PROTONIX) 40 MG tablet   Oral   Take 40 mg by mouth daily.         . potassium chloride SA (K-DUR,KLOR-CON) 20 MEQ tablet   Oral   Take 20 mEq by mouth daily.         . ranitidine (ZANTAC) 150 MG tablet   Oral   Take 150 mg by mouth at bedtime.         . ranolazine (RANEXA) 500 MG 12 hr tablet   Oral   Take 1 tablet (500 mg total) by mouth 2 (two) times daily.   60 tablet   1   . simvastatin (ZOCOR) 20 MG tablet   Oral   Take 1 tablet by mouth daily.         . simvastatin (ZOCOR) 5 MG tablet   Oral   Take 5 mg by mouth daily.         . solifenacin (VESICARE) 10 MG tablet   Oral   Take 10 mg by mouth daily.         . VOLTAREN 1 % GEL   Topical   Apply 1 application topically as needed.           Dispense as written.   . warfarin (COUMADIN) 5 MG tablet   Oral   Take 1 tablet by mouth daily.           Allergies Review of patient's allergies indicates no known allergies.  No family history on  file.  Social History Social History  Substance Use Topics  . Smoking status: Never Smoker   . Smokeless tobacco: Never Used  . Alcohol Use: No    Review of Systems Constitutional: Fall with No fever/chills Eyes: No visual changes. ENT: No sore throat. Cardiovascular: Denies chest pain. Respiratory: Denies shortness of breath. Gastrointestinal: No abdominal pain.  No nausea, no vomiting.  No diarrhea.  No constipation. Genitourinary: Negative for dysuria. Musculoskeletal: Right leg pain Skin: Negative for rash. Neurological: Negative for headaches, focal weakness or  numbness.  10-point ROS otherwise negative.  ____________________________________________   PHYSICAL EXAM:  VITAL SIGNS: ED Triage Vitals  Enc Vitals Group     BP 07/08/15 1804 140/69 mmHg     Pulse Rate 07/08/15 1804 63     Resp 07/08/15 1804 18     Temp 07/08/15 1804 98.6 F (37 C)     Temp Source 07/08/15 1804 Oral     SpO2 07/08/15 1804 98 %     Weight 07/08/15 1804 167 lb (75.751 kg)     Height 07/08/15 1804 5\' 3"  (1.6 m)     Head Cir --      Peak Flow --      Pain Score 07/08/15 1805 10     Pain Loc --      Pain Edu? --      Excl. in GC? --     Constitutional: Alert . Well appearing and in Mild distress. Eyes: Conjunctivae are normal. PERRL. EOMI. Head: Atraumatic. Nose: No congestion/rhinnorhea. Mouth/Throat: Mucous membranes are moist.  Oropharynx non-erythematous. Cardiovascular: Normal rate, regular rhythm. Grossly normal heart sounds.  Good peripheral circulation. Respiratory: Normal respiratory effort.  No retractions. Lungs CTAB. Gastrointestinal: Soft and nontender. No distention. Positive bowel sounds Musculoskeletal: Patient has some mild tenderness to palpation around her knee as well as her thigh. There is no distinct swelling and no bruising noted. She has some mild pain at her hip joint as well.  Neurologic:  Normal speech and language. Cranial nerves II through XII are grossly intact with no focal motor or neuro deficits. Skin:  Skin is warm, dry and intact.  Psychiatric: Mood and affect are normal.   ____________________________________________   LABS (all labs ordered are listed, but only abnormal results are displayed)  Labs Reviewed  BASIC METABOLIC PANEL - Abnormal; Notable for the following:    Glucose, Bld 163 (*)    Creatinine, Ser 1.03 (*)    GFR calc non Af Amer 55 (*)    All other components within normal limits  CBC - Abnormal; Notable for the following:    RDW 14.7 (*)    All other components within normal limits  URINALYSIS  COMPLETEWITH MICROSCOPIC ([facility] ONLY) - Abnormal; Notable for the following:    Color, Urine YELLOW (*)    APPearance HAZY (*)    Nitrite POSITIVE (*)    Bacteria, UA RARE (*)    Squamous Epithelial / LPF 0-5 (*)    All other components within normal limits  APTT - Abnormal; Notable for the following:    aPTT 40 (*)    All other components within normal limits  PROTIME-INR - Abnormal; Notable for the following:    Prothrombin Time 25.5 (*)    All other components within normal limits  BRAIN NATRIURETIC PEPTIDE   ____________________________________________  EKG  ED ECG REPORT I, Rebecka Apley, the attending physician, personally viewed and interpreted this ECG.   Date: 07/08/2015  EKG Time: 1826  Rate: 61  Rhythm: normal sinus rhythm  Axis: Normal  Intervals:none  ST&T Change: None  ____________________________________________  RADIOLOGY  Pelvis x-ray: No acute abnormality  Femur x-ray right: No evidence of fracture or dislocation, mild*arthritis of the right knee, small knee joint effusion noted, mild scattered vascular calcifications seen. ____________________________________________   PROCEDURES  Procedure(s) performed: None  Critical Care performed: No  ____________________________________________   INITIAL IMPRESSION / ASSESSMENT AND PLAN / ED COURSE  Pertinent labs & imaging results that were available during my care of the patient were reviewed by me and considered in my medical decision making (see chart for details).  This is a 68 rolled female who comes into the hospital today with some leg pain after a fall that occurred last night. I did see the results of the patient's blood work as well as her x-rays prior to being in the room. The patient did have some difficulty with her history so I did plan to do a CT of her head as well as of her leg given her pain. I explained this to the family but when CT arrived the family reports that they do not  want to stay because they have far to drive to go home. I explained to them that I cannot really fully evaluate the patient without having the studies to ensure that she did not have a fracture or some other acute injury. They report that they've still prefer to go home and not to stay. I did order some Tylenol for the patient for her pain and the patient and her family signed out AGAINST MEDICAL ADVICE. She is encouraged to follow back up with her primary care physician or return to the hospital she has any further questions or concerns. ____________________________________________   FINAL CLINICAL IMPRESSION(S) / ED DIAGNOSES  Final diagnoses:  Fall  Pain of right lower extremity      NEW MEDICATIONS STARTED DURING THIS VISIT:  Discharge Medication List as of 07/09/2015 12:49 AM       Note:  This document was prepared using Dragon voice recognition software and may include unintentional dictation errors.    Rebecka Apley, MD 07/09/15 [phone]

## 2015-07-09 NOTE — ED Notes (Signed)
Pt up to the bathroom. Limping with right leg. Pt states wants to go home and come back another time.

## 2015-07-19 ENCOUNTER — Emergency Department: Payer: Medicare Other

## 2015-07-19 ENCOUNTER — Emergency Department
Admission: EM | Admit: 2015-07-19 | Discharge: 2015-07-19 | Disposition: A | Discharge status: Home / Self Care | Payer: Medicare Other | Attending: Emergency Medicine | Admitting: Emergency Medicine

## 2015-07-19 ENCOUNTER — Encounter: Payer: Self-pay | Admitting: *Deleted

## 2015-07-19 DIAGNOSIS — Y939 Activity, unspecified: Secondary | ICD-10-CM | POA: Diagnosis not present

## 2015-07-19 DIAGNOSIS — S01511A Laceration without foreign body of lip, initial encounter: Secondary | ICD-10-CM | POA: Insufficient documentation

## 2015-07-19 DIAGNOSIS — S01512A Laceration without foreign body of oral cavity, initial encounter: Secondary | ICD-10-CM

## 2015-07-19 DIAGNOSIS — W010XXA Fall on same level from slipping, tripping and stumbling without subsequent striking against object, initial encounter: Secondary | ICD-10-CM | POA: Diagnosis not present

## 2015-07-19 DIAGNOSIS — Y929 Unspecified place or not applicable: Secondary | ICD-10-CM | POA: Insufficient documentation

## 2015-07-19 DIAGNOSIS — Y999 Unspecified external cause status: Secondary | ICD-10-CM | POA: Diagnosis not present

## 2015-07-19 DIAGNOSIS — S0990XA Unspecified injury of head, initial encounter: Secondary | ICD-10-CM | POA: Diagnosis present

## 2015-07-19 MED ORDER — OXYCODONE-ACETAMINOPHEN 5-325 MG PO TABS
1.0000 | ORAL_TABLET | Freq: Once | ORAL | Status: AC
  Administered 2015-07-19: 1 via ORAL

## 2015-07-19 NOTE — Discharge Instructions (Signed)
Mouth Laceration °A mouth laceration is a deep cut in the lining of your mouth (mucosa). The laceration may extend into your lip or go all of the way through your mouth and cheek. Lacerations inside your mouth may involve your tongue, the insides of your cheeks, or the upper surface of your mouth (palate). °Mouth lacerations may bleed a lot because your mouth has a very rich blood supply. Mouth lacerations may need to be repaired with stitches (sutures). °CAUSES °Any type of facial injury can cause a mouth laceration. Common causes include: °· Getting hit in the mouth. °· Being in a car accident. °SYMPTOMS °The most common sign of a mouth laceration is bleeding that fills the mouth. °DIAGNOSIS °Your health care provider can diagnose a mouth laceration by examining your mouth. Your mouth may need to be washed out (irrigated) with a sterile salt-water (saline) solution. Your health care provider may also have to remove any blood clots to determine how bad your injury is. You may need X-rays of the bones in your jaw or your face to rule out other injuries, such as dental injuries, facial fractures, or jaw fractures. °TREATMENT °Treatment depends on the location and severity of your injury. Small mouth lacerations may not need treatment if bleeding has stopped. You may need sutures if: °· You have a tongue laceration. °· Your mouth laceration is large or deep, or it continues to bleed. °If sutures are necessary, your health care provider will use absorbable sutures that dissolve as your body heals. You may also receive antibiotic medicine or a tetanus shot. °HOME CARE INSTRUCTIONS °· Take medicines only as directed by your health care provider. °· If you were prescribed an antibiotic medicine, finish all of it even if you start to feel better. °· Eat as directed by your health care provider. You may only be able to drink liquids or eat soft foods for a few days. °· Rinse your mouth with a warm, salt-water rinse 4-6  times per day or as directed by your health care provider. You can make a salt-water rinse by mixing one tsp of salt into two cups of warm water. °· Do not poke the sutures with your tongue. Doing that can loosen them. °· Check your wound every day for signs of infection. It is normal to have a white or gray patch over your wound while it heals. Watch for: °¨ Redness. °¨ Swelling. °¨ Blood or pus. °· Maintain regular oral hygiene, if possible. Gently brush your teeth with a soft, nylon-bristled toothbrush 2 times per day. °· Keep all follow-up visits as directed by your health care provider. This is important. °SEEK MEDICAL CARE IF: °· You were given a tetanus shot and have swelling, severe pain, redness, or bleeding at the injection site. °· You have a fever. °· Your pain is not controlled with medicine. °· You have redness, swelling, or pain at your wound that is getting worse. °· You have fresh bleeding or pus coming from your wound. °· The edges of your wound break open. °· You develop swollen, tender glands in your throat. °SEEK IMMEDIATE MEDICAL CARE IF:  °· Your face or the area under your jaw becomes swollen. °· You have trouble breathing or swallowing. °  °This information is not intended to replace advice given to you by your health care provider. Make sure you discuss any questions you have with your health care provider. °  °Document Released: 01/12/2005 Document Revised: 05/29/2014 Document Reviewed: 01/03/2014 °Elsevier Interactive Patient   Education ©2016 [employer] ° °

## 2015-07-19 NOTE — ED Notes (Signed)
Pt's sister called for pick up

## 2015-07-19 NOTE — ED Notes (Signed)
Patient transported to CT/Xray. 

## 2015-07-19 NOTE — ED Notes (Signed)
Pt ambulated to bathroom at this time with assist x 1. Pt tolerated well, NAD noted. Will continue to monitor.

## 2015-07-19 NOTE — ED Notes (Addendum)
Pt to ED from home after mechanical fall this evening. Pt fell, hit head, no loss of LOC. Pt is currently taking plavix daily. Pt with lip lac noted, bleeding controlled at this time. Swelling noted. Pt also c/c of left leg pain. No deformity noted. Pt AAOx4, vitals stable. MD Manson PasseyBrown at bedside upon arrival.

## 2015-07-19 NOTE — ED Notes (Signed)
Pt's sister, Dionne AnoMyrtlean, cell: [phone], called to check on pt, request call for pt DC

## 2015-07-19 NOTE — ED Provider Notes (Signed)
[facility] Emergency Department Provider Note  ____________________________________________  Time seen: 1:30 AM  I have reviewed the triage vital signs and the nursing notes.   HISTORY  Chief Complaint Fall and Lip Laceration      HPI Krista Frazier is a 65 y.o. female presents with history of trip and fall this evening striking left side of her face/head. The patient is taking Plavix daily. Patient admits to a lip laceration and bleeding control at this time.    Past Medical History  Diagnosis Date  . Coronary artery disease   . Headache     "not too often" (12/15/2013)  . Stroke [facility])     "weak on left arm/leg since"  . Seizures (HCC)     "? kind" (12/15/2013)  . Arthritis     "arms and knees" (12/15/2013)  . Type II diabetes mellitus (HCC)   . COPD (chronic obstructive pulmonary disease) (HCC)     [name] 12/14/2013  . Hypertension     [name] 12/14/2013  . High cholesterol     /notes 12/14/2013    Patient Active Problem List   Diagnosis Date Noted  . Coronary artery disease involving native coronary artery of native heart without angina pectoris   . Hx of aneurysm   . Chest pain 12/14/2013    Past Surgical History  Procedure Laterality Date  . Vaginal hysterectomy    . Back surgery    . Cervical disc surgery    . Foot surgery Right     "took a bone out"  . Coronary angioplasty with stent placement  2014    [name] 12/14/2013  . Cardiac catheterization  12/13/2013    [name] 12/14/2013  . Nasal septum surgery    . Carpal tunnel release Bilateral     Current Outpatient Rx  Name  Route  Sig  Dispense  Refill  . albuterol (PROVENTIL HFA;VENTOLIN HFA) 108 (90 BASE) MCG/ACT inhaler   Inhalation   Inhale 2 puffs into the lungs every 6 (six) hours as needed for wheezing or shortness of breath.         Marland Kitchen amLODipine (NORVASC) 5 MG tablet   Oral   Take 1 tablet by mouth daily.         Marland Kitchen aspirin EC 81 MG tablet   Oral  Take 81 mg by mouth daily.         . clopidogrel (PLAVIX) 75 MG tablet   Oral   Take 1 tablet by mouth daily.         Marland Kitchen docusate sodium (COLACE) 100 MG capsule   Oral   Take 100 mg by mouth at bedtime.          . Eslicarbazepine Acetate (APTIOM) 200 MG TABS   Oral   Take 200 mg by mouth daily.         . fluticasone (FLONASE) 50 MCG/ACT nasal spray   Each Nare   Place 2 sprays into both nostrils daily.         . Fluticasone-Salmeterol (ADVAIR) 500-50 MCG/DOSE AEPB   Inhalation   Inhale 1 puff into the lungs 2 (two) times daily.         Marland Kitchen gabapentin (NEURONTIN) 300 MG capsule   Oral   Take 300-600 mg by mouth 3 (three) times daily. 1 capsule twice daily and 2 capsules at bedtime         . glimepiride (AMARYL) 2 MG tablet   Oral   Take 2 mg  by mouth daily with breakfast.         . isosorbide mononitrate (IMDUR) 30 MG 24 hr tablet   Oral   Take 30 mg by mouth daily.         Marland Kitchen. lacosamide (VIMPAT) 200 MG TABS tablet   Oral   Take 200 mg by mouth 2 (two) times daily.         Marland Kitchen. lisinopril (PRINIVIL,ZESTRIL) 20 MG tablet   Oral   Take 20 mg by mouth daily.         . metoprolol succinate (TOPROL-XL) 25 MG 24 hr tablet   Oral   Take 1 tablet (25 mg total) by mouth daily.   30 tablet   1   . montelukast (SINGULAIR) 10 MG tablet   Oral   Take 10 mg by mouth at bedtime.         Ethelda Chick. Oyster Shell (OYSTER CALCIUM) 500 MG TABS tablet   Oral   Take 500 mg of elemental calcium by mouth daily.         . pantoprazole (PROTONIX) 40 MG tablet   Oral   Take 40 mg by mouth daily.         . potassium chloride SA (K-DUR,KLOR-CON) 20 MEQ tablet   Oral   Take 20 mEq by mouth daily.         . ranitidine (ZANTAC) 150 MG tablet   Oral   Take 150 mg by mouth at bedtime.         . ranolazine (RANEXA) 500 MG 12 hr tablet   Oral   Take 1 tablet (500 mg total) by mouth 2 (two) times daily.   60 tablet   1   . simvastatin (ZOCOR) 20 MG tablet   Oral    Take 1 tablet by mouth daily.         . simvastatin (ZOCOR) 5 MG tablet   Oral   Take 5 mg by mouth daily.         . solifenacin (VESICARE) 10 MG tablet   Oral   Take 10 mg by mouth daily.         . VOLTAREN 1 % GEL   Topical   Apply 1 application topically as needed.           Dispense as written.   . warfarin (COUMADIN) 5 MG tablet   Oral   Take 1 tablet by mouth daily.           Allergies No known drug allergies History reviewed. No pertinent family history.  Social History Social History  Substance Use Topics  . Smoking status: Never Smoker   . Smokeless tobacco: Never Used  . Alcohol Use: No    Review of Systems  Constitutional: Negative for fever. Eyes: Negative for visual changes. ENT: Negative for sore throat.Positive for laceration in the mouth Cardiovascular: Negative for chest pain. Respiratory: Negative for shortness of breath. Gastrointestinal: Negative for abdominal pain, vomiting and diarrhea. Genitourinary: Negative for dysuria. Musculoskeletal: Negative for back pain. Skin: Negative for rash. Neurological: Negative for headaches, focal weakness or numbness.   10-point ROS otherwise negative.  ____________________________________________   PHYSICAL EXAM:  VITAL SIGNS: ED Triage Vitals  Enc Vitals Group     BP 07/19/15 0135 157/89 mmHg     Pulse Rate 07/19/15 0135 85     Resp 07/19/15 0135 22     Temp 07/19/15 0131 98.5 F (36.9 C)     Temp Source 07/19/15  0131 Oral     SpO2 07/19/15 0135 98 %     Weight --      Height 07/19/15 0131 5\' 3"  (1.6 m)     Head Cir --      Peak Flow --      Pain Score 07/19/15 0132 6     Pain Loc --      Pain Edu? --      Excl. in GC? --      Constitutional: Alert and oriented. Well appearing and in no distress. Eyes: Conjunctivae are normal. PERRL. Normal extraocular movements. ENT   Head: Normocephalic and atraumatic.   Nose: No congestion/rhinnorhea.   Mouth/Throat:  Mucous membranes are moist.1.5 cm linear laceration noted on the left upper lip   Neck: No stridor. Hematological/Lymphatic/Immunilogical: No cervical lymphadenopathy. Cardiovascular: Normal rate, regular rhythm. Normal and symmetric distal pulses are present in all extremities. No murmurs, rubs, or gallops. Respiratory: Normal respiratory effort without tachypnea nor retractions. Breath sounds are clear and equal bilaterally. No wheezes/rales/rhonchi. Gastrointestinal: Soft and nontender. No distention. There is no CVA tenderness. Genitourinary: deferred Musculoskeletal: Nontender with normal range of motion in all extremities. No joint effusions.  No lower extremity tenderness nor edema. Neurologic:  Normal speech and language. No gross focal neurologic deficits are appreciated. Speech is normal.  Skin:  Skin is warm, dry and intact. No rash noted. Psychiatric: Mood and affect are normal. Speech and behavior are normal. Patient exhibits appropriate insight and judgment.   CT Head Wo Contrast (Final result) Result time: 07/19/15 02:35:21   Final result by Rad Results In Interface (07/19/15 02:35:21)   Narrative:   CLINICAL DATA: 65 year old female with fall and head injury.  EXAM: CT HEAD WITHOUT CONTRAST  CT CERVICAL SPINE WITHOUT CONTRAST  TECHNIQUE: Multidetector CT imaging of the head and cervical spine was performed following the standard protocol without intravenous contrast. Multiplanar CT image reconstructions of the cervical spine were also generated.  COMPARISON: Head CT dated 12/15/2013  FINDINGS: CT HEAD FINDINGS  The ventricles and sulci are appropriate in size for patient's age. Minimal periventricular and deep white matter chronic microvascular ischemic changes noted. There is no acute intracranial hemorrhage. No mass effect or midline shift noted.  The visualized paranasal sinuses and mastoid air cells are clear. Stable postsurgical changes of left  suboccipital craniectomy. No acute fracture.  CT CERVICAL SPINE FINDINGS  There is no acute fracture or subluxation of the cervical spine.There multilevel degenerative changes. C3-C7 laminectomy and posterior fusion hardware.The odontoid and spinous processes are intact.There is normal anatomic alignment of the C1-C2 lateral masses. The visualized soft tissues appear unremarkable.  There are small bilateral thyroid hypodense nodules ultrasound may provide better evaluation.  IMPRESSION: No acute intracranial hemorrhage.  No acute/traumatic cervical spine pathology.   Electronically Signed By: Elgie CollardArash Radparvar M.D. On: 07/19/2015 02:35          CT Cervical Spine Wo Contrast (Final result) Result time: 07/19/15 02:35:21   Final result by Rad Results In Interface (07/19/15 02:35:21)   Narrative:   CLINICAL DATA: 65 year old female with fall and head injury.  EXAM: CT HEAD WITHOUT CONTRAST  CT CERVICAL SPINE WITHOUT CONTRAST  TECHNIQUE: Multidetector CT imaging of the head and cervical spine was performed following the standard protocol without intravenous contrast. Multiplanar CT image reconstructions of the cervical spine were also generated.  COMPARISON: Head CT dated 12/15/2013  FINDINGS: CT HEAD FINDINGS  The ventricles and sulci are appropriate in size for patient's age. Minimal periventricular and  deep white matter chronic microvascular ischemic changes noted. There is no acute intracranial hemorrhage. No mass effect or midline shift noted.  The visualized paranasal sinuses and mastoid air cells are clear. Stable postsurgical changes of left suboccipital craniectomy. No acute fracture.  CT CERVICAL SPINE FINDINGS  There is no acute fracture or subluxation of the cervical spine.There multilevel degenerative changes. C3-C7 laminectomy and posterior fusion hardware.The odontoid and spinous processes are intact.There is normal anatomic alignment  of the C1-C2 lateral masses. The visualized soft tissues appear unremarkable.  There are small bilateral thyroid hypodense nodules ultrasound may provide better evaluation.  IMPRESSION: No acute intracranial hemorrhage.  No acute/traumatic cervical spine pathology.   Electronically Signed By: Elgie Collard M.D. On: 07/19/2015 02:35          DG Tibia/Fibula Left (Final result) Result time: 07/19/15 02:10:51   Final result by Rad Results In Interface (07/19/15 02:10:51)   Narrative:   CLINICAL DATA: Status post fall, with left lower leg pain. Initial encounter.  EXAM: LEFT TIBIA AND FIBULA - 2 VIEW  COMPARISON: None.  FINDINGS: There is no evidence of fracture or dislocation. The tibia and fibula appear grossly intact.  Mild degenerative change is noted at the patellofemoral compartment. A fabella is noted. A plantar calcaneal spur is noted. Scattered vascular calcifications are seen. No additional soft tissue abnormalities are characterized on radiograph.  IMPRESSION: 1. No evidence of fracture or dislocation. 2. Scattered vascular calcifications seen. 3. Mild degenerative change at the left knee.   Electronically Signed By: Roanna Raider M.D. On: 07/19/2015 02:10           INITIAL IMPRESSION / ASSESSMENT AND PLAN / ED COURSE  Pertinent labs & imaging results that were available during my care of the patient were reviewed by me and considered in my medical decision making (see chart for details).  Patient given Percocet for pain emergency department pain improvement.  ____________________________________________   FINAL CLINICAL IMPRESSION(S) / ED DIAGNOSES  Final diagnoses:  Laceration of mouth, initial encounter      Darci Current, MD 07/19/15 [phone]

## 2016-05-18 ENCOUNTER — Emergency Department: Payer: Medicare Other

## 2016-05-18 ENCOUNTER — Encounter: Payer: Self-pay | Admitting: Emergency Medicine

## 2016-05-18 ENCOUNTER — Emergency Department
Admission: EM | Admit: 2016-05-18 | Discharge: 2016-05-18 | Disposition: A | Discharge status: Home / Self Care | Payer: Medicare Other | Attending: Emergency Medicine | Admitting: Emergency Medicine

## 2016-05-18 DIAGNOSIS — M2540 Effusion, unspecified joint: Secondary | ICD-10-CM

## 2016-05-18 DIAGNOSIS — Y999 Unspecified external cause status: Secondary | ICD-10-CM | POA: Diagnosis not present

## 2016-05-18 DIAGNOSIS — W109XXA Fall (on) (from) unspecified stairs and steps, initial encounter: Secondary | ICD-10-CM | POA: Diagnosis not present

## 2016-05-18 DIAGNOSIS — M25462 Effusion, left knee: Secondary | ICD-10-CM | POA: Diagnosis not present

## 2016-05-18 DIAGNOSIS — Z7901 Long term (current) use of anticoagulants: Secondary | ICD-10-CM | POA: Diagnosis not present

## 2016-05-18 DIAGNOSIS — Z7982 Long term (current) use of aspirin: Secondary | ICD-10-CM | POA: Insufficient documentation

## 2016-05-18 DIAGNOSIS — Y9222 Religious institution as the place of occurrence of the external cause: Secondary | ICD-10-CM | POA: Insufficient documentation

## 2016-05-18 DIAGNOSIS — Y939 Activity, unspecified: Secondary | ICD-10-CM | POA: Insufficient documentation

## 2016-05-18 DIAGNOSIS — Z79899 Other long term (current) drug therapy: Secondary | ICD-10-CM | POA: Diagnosis not present

## 2016-05-18 DIAGNOSIS — I10 Essential (primary) hypertension: Secondary | ICD-10-CM | POA: Insufficient documentation

## 2016-05-18 DIAGNOSIS — S8992XA Unspecified injury of left lower leg, initial encounter: Secondary | ICD-10-CM | POA: Diagnosis present

## 2016-05-18 DIAGNOSIS — Z7984 Long term (current) use of oral hypoglycemic drugs: Secondary | ICD-10-CM | POA: Insufficient documentation

## 2016-05-18 DIAGNOSIS — E119 Type 2 diabetes mellitus without complications: Secondary | ICD-10-CM | POA: Insufficient documentation

## 2016-05-18 DIAGNOSIS — I2510 Atherosclerotic heart disease of native coronary artery without angina pectoris: Secondary | ICD-10-CM | POA: Insufficient documentation

## 2016-05-18 DIAGNOSIS — J449 Chronic obstructive pulmonary disease, unspecified: Secondary | ICD-10-CM | POA: Insufficient documentation

## 2016-05-18 MED ORDER — OXYCODONE-ACETAMINOPHEN 5-325 MG PO TABS
1.0000 | ORAL_TABLET | Freq: Once | ORAL | Status: AC
  Administered 2016-05-18: 1 via ORAL
  Filled 2016-05-18: qty 1

## 2016-05-18 NOTE — ED Triage Notes (Signed)
Pt reports tripping on stairs at church yesterday. Denies dizziness prior to fall. Denies LOC, denies hitting head. Pt reports left knee pain.

## 2016-05-18 NOTE — ED Provider Notes (Signed)
[facility] Emergency Department Provider Note  ____________________________________________  Time seen: Approximately 1:25 PM  I have reviewed the triage vital signs and the nursing notes.   HISTORY  Chief Complaint Knee Pain and Fall    HPI Krista Frazier is a 66 y.o. female that presents to emergency department with left knee pain and left ankle pain after falling at church yesterday. She states that she missed a step, which caused her to fall.Patient states that the pastor seen her fall. She denies hitting head or losing consciousness. She has had difficulty walking on leg since. She has had a seizure in the past and denies having any seizure yesterday. She denies fever, shortness of breath, chest pain, nausea, vomiting, abdominal pain, numbness, tingling.   Past Medical History:  Diagnosis Date  . Arthritis    "arms and knees" (12/15/2013)  . COPD (chronic obstructive pulmonary disease) (HCC)    [name] 12/14/2013  . Coronary artery disease   . Headache    "not too often" (12/15/2013)  . High cholesterol    [name] 12/14/2013  . Hypertension    [name] 12/14/2013  . Seizures (HCC)    "? kind" (12/15/2013)  . Stroke [facility])    "weak on left arm/leg since"  . Type II diabetes mellitus [facility])     Patient Active Problem List   Diagnosis Date Noted  . Coronary artery disease involving native coronary artery of native heart without angina pectoris   . Hx of aneurysm   . Chest pain 12/14/2013    Past Surgical History:  Procedure Laterality Date  . BACK SURGERY    . CARDIAC CATHETERIZATION  12/13/2013   [name] 12/14/2013  . CARPAL TUNNEL RELEASE Bilateral   . CERVICAL DISC SURGERY    . CORONARY ANGIOPLASTY WITH STENT PLACEMENT  2014   [name] 12/14/2013  . FOOT SURGERY Right    "took a bone out"  . NASAL SEPTUM SURGERY    . VAGINAL HYSTERECTOMY      Prior to Admission medications   Medication Sig Start Date End Date Taking? Authorizing  Provider  albuterol (PROVENTIL HFA;VENTOLIN HFA) 108 (90 BASE) MCG/ACT inhaler Inhale 2 puffs into the lungs every 6 (six) hours as needed for wheezing or shortness of breath.    Historical Provider, MD  amLODipine (NORVASC) 5 MG tablet Take 1 tablet by mouth daily. 06/26/15   Historical Provider, MD  aspirin EC 81 MG tablet Take 81 mg by mouth daily.    Historical Provider, MD  clopidogrel (PLAVIX) 75 MG tablet Take 1 tablet by mouth daily. 06/26/15   Historical Provider, MD  docusate sodium (COLACE) 100 MG capsule Take 100 mg by mouth at bedtime.     Historical Provider, MD  Eslicarbazepine Acetate (APTIOM) 200 MG TABS Take 200 mg by mouth daily.    Historical Provider, MD  fluticasone (FLONASE) 50 MCG/ACT nasal spray Place 2 sprays into both nostrils daily.    Historical Provider, MD  Fluticasone-Salmeterol (ADVAIR) 500-50 MCG/DOSE AEPB Inhale 1 puff into the lungs 2 (two) times daily.    Historical Provider, MD  gabapentin (NEURONTIN) 300 MG capsule Take 300-600 mg by mouth 3 (three) times daily. 1 capsule twice daily and 2 capsules at bedtime    Historical Provider, MD  glimepiride (AMARYL) 2 MG tablet Take 2 mg by mouth daily with breakfast.    Historical Provider, MD  isosorbide mononitrate (IMDUR) 30 MG 24 hr tablet Take 30 mg by mouth daily.    Historical Provider,  MD  lacosamide (VIMPAT) 200 MG TABS tablet Take 200 mg by mouth 2 (two) times daily.    Historical Provider, MD  lisinopril (PRINIVIL,ZESTRIL) 20 MG tablet Take 20 mg by mouth daily.    Historical Provider, MD  metoprolol succinate (TOPROL-XL) 25 MG 24 hr tablet Take 1 tablet (25 mg total) by mouth daily. 12/18/13   Donielle Margaretann Loveless, PA-C  montelukast (SINGULAIR) 10 MG tablet Take 10 mg by mouth at bedtime.    Historical Provider, MD  Ethelda Chick (OYSTER CALCIUM) 500 MG TABS tablet Take 500 mg of elemental calcium by mouth daily.    Historical Provider, MD  pantoprazole (PROTONIX) 40 MG tablet Take 40 mg by mouth daily.     Historical Provider, MD  potassium chloride SA (K-DUR,KLOR-CON) 20 MEQ tablet Take 20 mEq by mouth daily.    Historical Provider, MD  ranitidine (ZANTAC) 150 MG tablet Take 150 mg by mouth at bedtime.    Historical Provider, MD  ranolazine (RANEXA) 500 MG 12 hr tablet Take 1 tablet (500 mg total) by mouth 2 (two) times daily. 12/18/13   Donielle Margaretann Loveless, PA-C  simvastatin (ZOCOR) 20 MG tablet Take 1 tablet by mouth daily. 06/26/15   Historical Provider, MD  simvastatin (ZOCOR) 5 MG tablet Take 5 mg by mouth daily.    Historical Provider, MD  solifenacin (VESICARE) 10 MG tablet Take 10 mg by mouth daily.    Historical Provider, MD  VOLTAREN 1 % GEL Apply 1 application topically as needed. 06/04/15   Historical Provider, MD  warfarin (COUMADIN) 5 MG tablet Take 1 tablet by mouth daily. 06/26/15   Historical Provider, MD    Allergies Patient has no known allergies.  No family history on file.  Social History Social History  Substance Use Topics  . Smoking status: Never Smoker  . Smokeless tobacco: Never Used  . Alcohol use No     Review of Systems  Constitutional: No fever/chills Cardiovascular: No chest pain. Respiratory: No SOB. Gastrointestinal: No abdominal pain.  No nausea, no vomiting.  Musculoskeletal: Positive for left knee pain and left ankle pain. Skin: Negative for rash, abrasions, lacerations, ecchymosis. Neurological: Negative for headaches, numbness or tingling   ____________________________________________   PHYSICAL EXAM:  VITAL SIGNS: ED Triage Vitals [05/18/16 1129]  Enc Vitals Group     BP 140/86     Pulse Rate 95     Resp 18     Temp 97.7 F (36.5 C)     Temp Source Oral     SpO2 98 %     Weight 162 lb (73.5 kg)     Height  (1.6 m)     Head Circumference      Peak Flow      Pain Score 10     Pain Loc      Pain Edu?      Excl. in GC?      Eyes: Conjunctivae are normal. PERRL. EOMI. Head: Atraumatic. ENT:      Ears:      Nose: No  congestion/rhinnorhea.      Mouth/Throat: Mucous membranes are moist.  Neck: No stridor.   Cardiovascular: Normal rate, regular rhythm.  Good peripheral circulation. Respiratory: Normal respiratory effort without tachypnea or retractions. Lungs CTAB. Good air entry to the bases with no decreased or absent breath sounds. Gastrointestinal: Bowel sounds 4 quadrants. Soft and nontender to palpation. No guarding or rigidity. No palpable masses. No distention.  Musculoskeletal: No gross deformities appreciated. Moderate  swelling of left knee. Limited range of motion of left knee. No swelling of left ankle. Mild tenderness to palpation over lateral and medial malleolus. Neurologic:  Normal speech and language. No gross focal neurologic deficits are appreciated.  Skin:  Skin is warm, dry and intact. No rash noted.   ____________________________________________   LABS (all labs ordered are listed, but only abnormal results are displayed)  Labs Reviewed - No data to display ____________________________________________  EKG   ____________________________________________  RADIOLOGY Lexine Baton, personally viewed and evaluated these images (plain radiographs) as part of my medical decision making, as well as reviewing the written report by the radiologist.  Dg Ankle Complete Left  Result Date: 05/18/2016 CLINICAL DATA:  Pt reports tripping on stairs at church yesterday. Denies dizziness prior to fall. Denies LOC, denies hitting head. Pt reports left knee pain. EXAM: LEFT ANKLE COMPLETE - 3+ VIEW COMPARISON:  Left knee radiographs-earlier same day FINDINGS: No fracture or dislocation. Joint spaces are preserved. Ankle mortise is preserved. No ankle joint effusion. Moderate-sized plantar calcaneal spur. Minimal enthesopathic change involving the Achilles tendon insertion site. Distal vascular calcifications. No radiopaque foreign body. IMPRESSION: 1. No acute findings. 2. Moderate-sized plantar  calcaneal spur. Electronically Signed   By: Simonne Come M.D.   On: 05/18/2016 12:49   Dg Knee Complete 4 Views Left  Result Date: 05/18/2016 CLINICAL DATA:  Pt reports tripping on stairs at church yesterday. Denies dizziness prior to fall. Denies LOC, denies hitting head. Pt reports left knee pain. EXAM: LEFT KNEE - COMPLETE 4+ VIEW COMPARISON:  Left ankle radiographs-earlier same day FINDINGS: Small to moderate-sized knee joint effusion. No evidence of lipohemarthrosis. No fracture or dislocation. Moderate tricompartmental degenerative change of the knee, likely worse within the medial compartment and patellofemoral joints with joint space loss, subchondral sclerosis and osteophytosis. There is spurring the tibial spines. No evidence of chondrocalcinosis. Ill-defined peripherally calcified structures posterior to the knee are indeterminate though favored to represent areas of fat necrosis. IMPRESSION: 1. Small to moderate-sized joint effusion. Otherwise, no acute findings. 2. Moderate tricompartmental degenerative change of the knee. Electronically Signed   By: Simonne Come M.D.   On: 05/18/2016 12:53    ____________________________________________    PROCEDURES  Procedure(s) performed:    Procedures    Medications  oxyCODONE-acetaminophen (PERCOCET/ROXICET) 5-325 MG per tablet 1 tablet (1 tablet Oral Given 05/18/16 1232)     ____________________________________________   INITIAL IMPRESSION / ASSESSMENT AND PLAN / ED COURSE  Pertinent labs & imaging results that were available during my care of the patient were reviewed by me and considered in my medical decision making (see chart for details).  Review of the [place] CSRS was performed in accordance of the [facility] prior to dispensing any controlled drugs.   Patient's diagnosis is consistent with knee effusion after fall. Vital signs and exam are reassuring. Knee x-ray consistent with effusion. No acute bony normality is on ankle x-ray.  Patient received Percocet in ED and became very sleepy. She was held in the ED until Percocet wore off. She was awake and alert before leaving. She had a driver who was in the room. Knee was Ace wrapped. Patient is to follow up with PCP as directed. Patient is given ED precautions to return to the ED for any worsening or new symptoms.     ____________________________________________  FINAL CLINICAL IMPRESSION(S) / ED DIAGNOSES  Final diagnoses:  Joint effusion      NEW MEDICATIONS STARTED DURING THIS VISIT:  Discharge Medication  List as of 05/18/2016  3:06 PM          This chart was dictated using voice recognition software/Dragon. Despite best efforts to proofread, errors can occur which can change the meaning. Any change was purely unintentional.    Enid Derry, PA-C 05/18/16 1835    Jene Every, MD 05/25/16 [phone]

## 2017-06-17 IMAGING — DX DG KNEE COMPLETE 4+V*L*
4 series · 4 of 4 positions shown · non-contrast
Comparison: Left ankle radiographs-earlier same day

CLINICAL DATA: Pt reports tripping on stairs at church yesterday.
Denies dizziness prior to fall. Denies LOC, denies hitting head. Pt
reports left knee pain.

EXAM:
LEFT KNEE - COMPLETE 4+ VIEW

[knee ap]
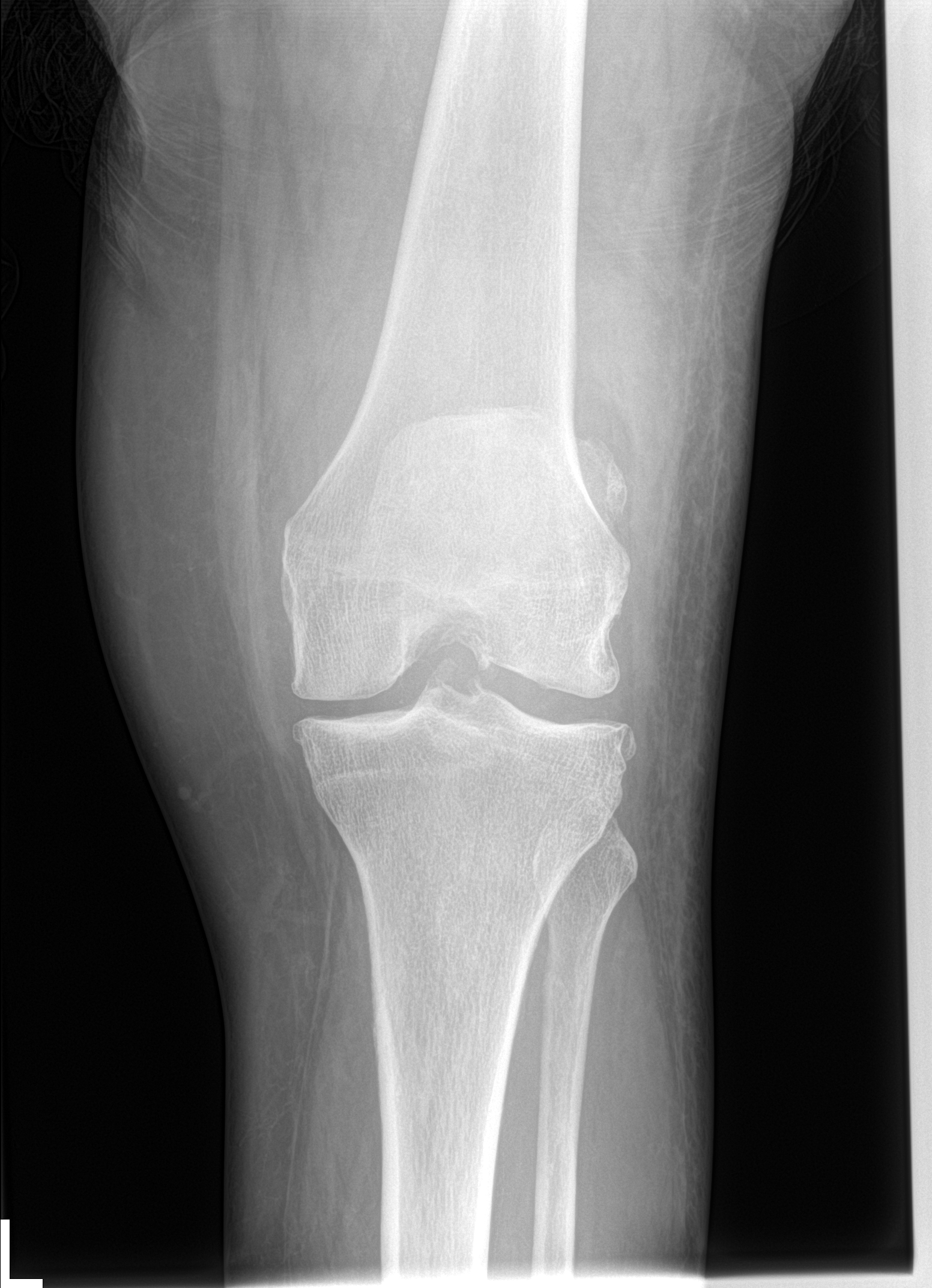

[knee tunnel]
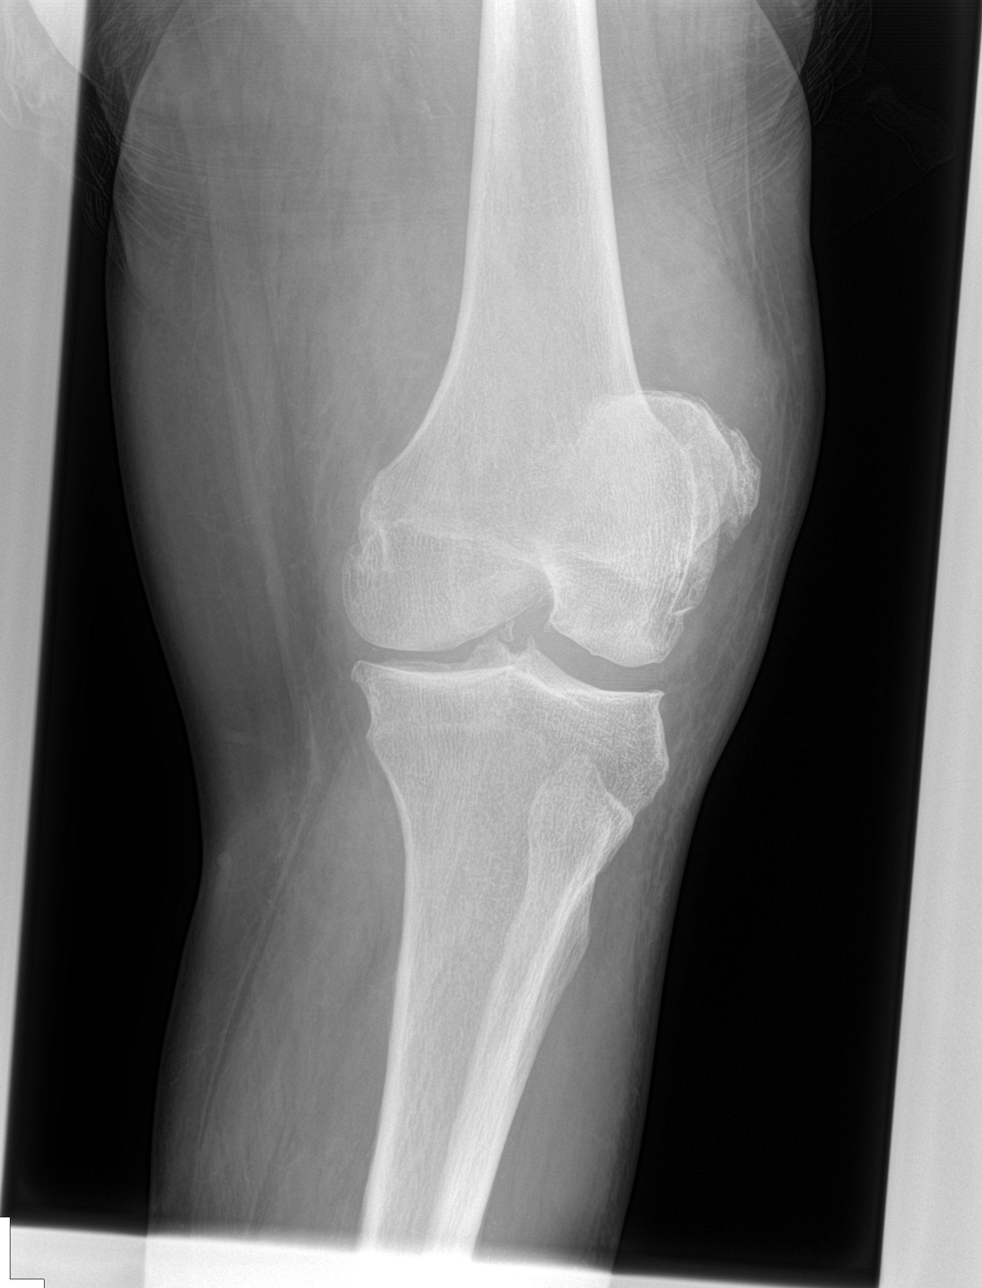

[patella skyline]
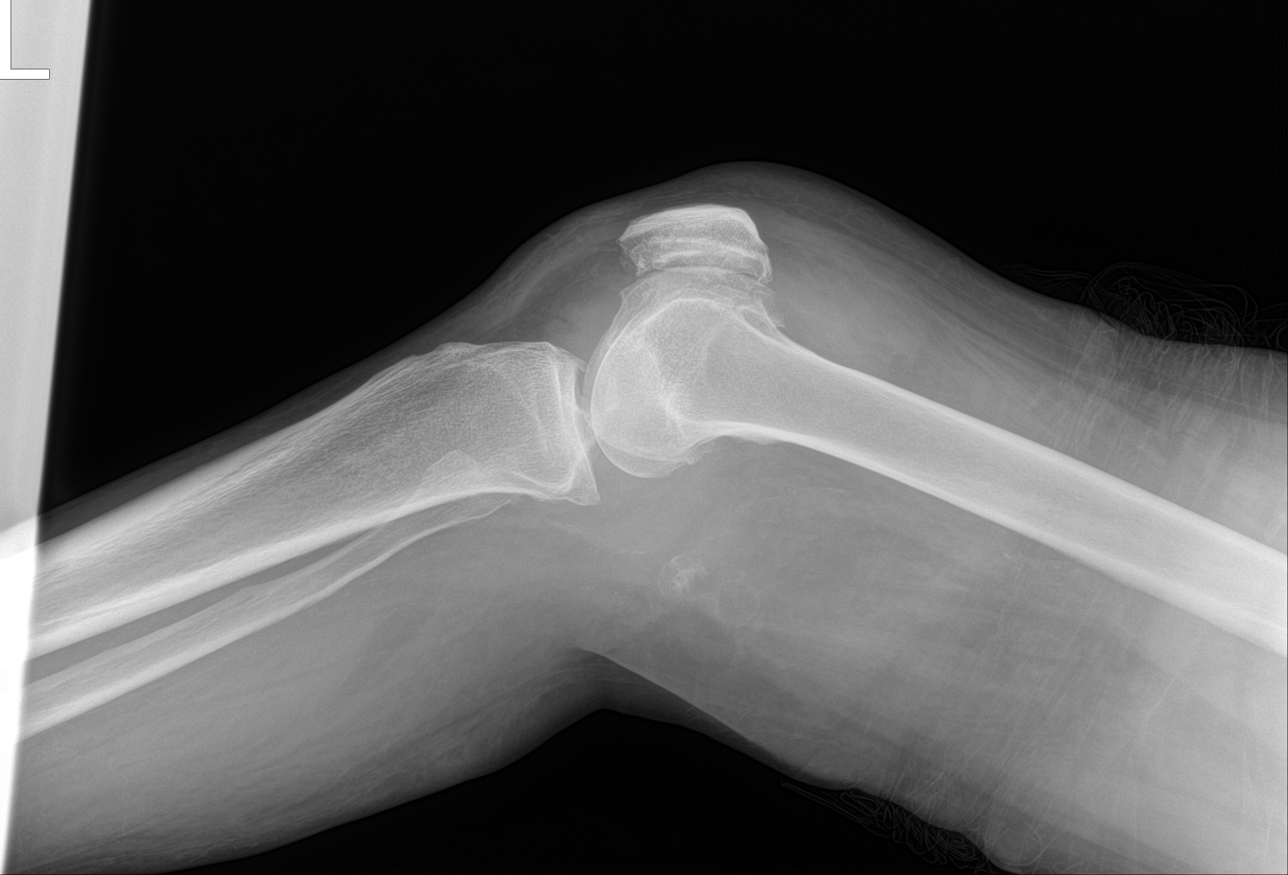

[knee obl]
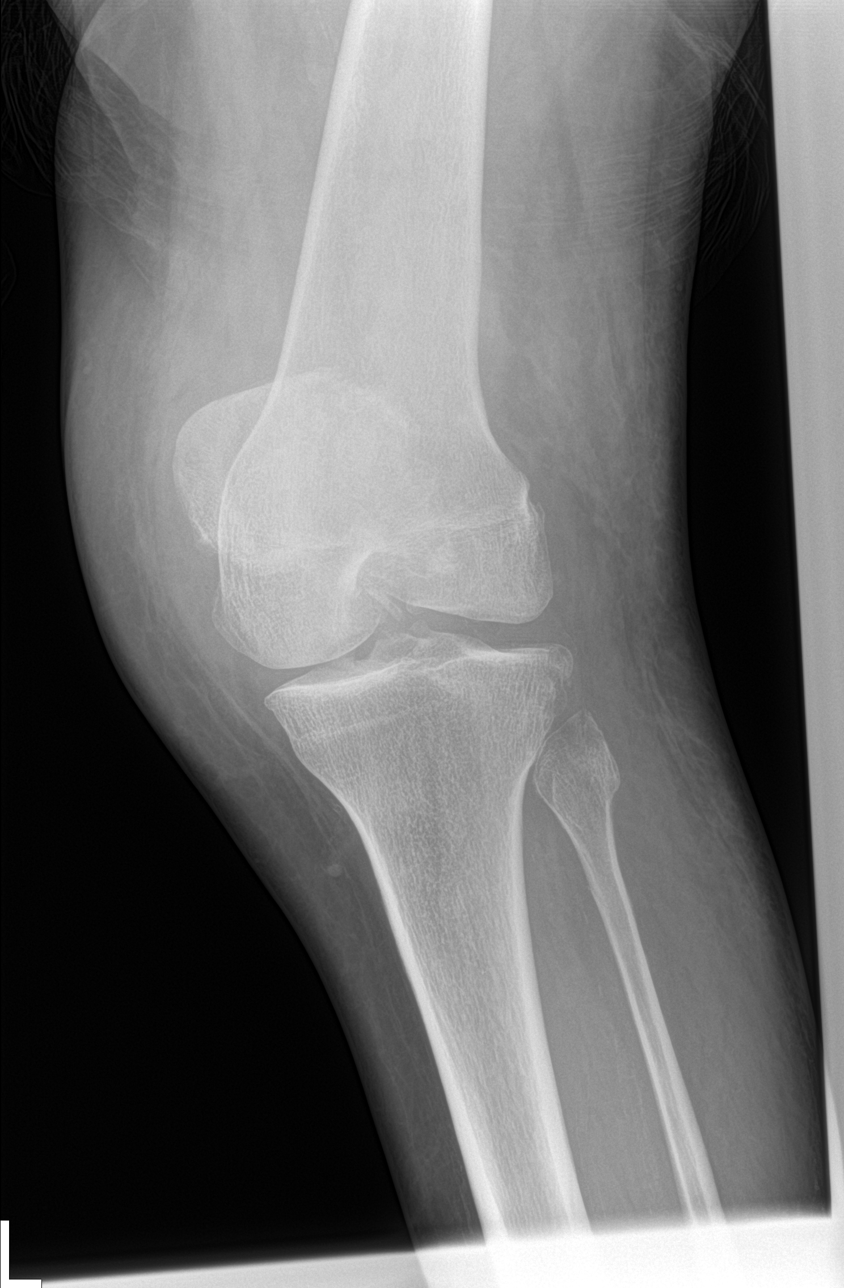

[4 of 4 positions shown; findings below may reference images not displayed]

FINDINGS: Small to moderate-sized knee joint effusion. No evidence of
lipohemarthrosis. No fracture or dislocation. Moderate
tricompartmental degenerative change of the knee, likely worse
within the medial compartment and patellofemoral joints with joint
space loss, subchondral sclerosis and osteophytosis. There is
spurring the tibial spines. No evidence of chondrocalcinosis.
Ill-defined peripherally calcified structures posterior to the knee
are indeterminate though favored to represent areas of fat necrosis.
IMPRESSION: 1. Small to moderate-sized joint effusion. Otherwise, no acute
findings.
2. Moderate tricompartmental degenerative change of the knee.

## 2017-12-03 ENCOUNTER — Emergency Department
Admission: EM | Admit: 2017-12-03 | Discharge: 2017-12-04 | Disposition: A | Discharge status: Home / Self Care | Payer: Medicare Other | Attending: Emergency Medicine | Admitting: Emergency Medicine

## 2017-12-03 ENCOUNTER — Emergency Department: Payer: Medicare Other

## 2017-12-03 DIAGNOSIS — I2510 Atherosclerotic heart disease of native coronary artery without angina pectoris: Secondary | ICD-10-CM | POA: Insufficient documentation

## 2017-12-03 DIAGNOSIS — Z955 Presence of coronary angioplasty implant and graft: Secondary | ICD-10-CM | POA: Insufficient documentation

## 2017-12-03 DIAGNOSIS — E119 Type 2 diabetes mellitus without complications: Secondary | ICD-10-CM | POA: Insufficient documentation

## 2017-12-03 DIAGNOSIS — N390 Urinary tract infection, site not specified: Secondary | ICD-10-CM

## 2017-12-03 DIAGNOSIS — K8020 Calculus of gallbladder without cholecystitis without obstruction: Secondary | ICD-10-CM | POA: Insufficient documentation

## 2017-12-03 DIAGNOSIS — Z7902 Long term (current) use of antithrombotics/antiplatelets: Secondary | ICD-10-CM | POA: Insufficient documentation

## 2017-12-03 DIAGNOSIS — Z7982 Long term (current) use of aspirin: Secondary | ICD-10-CM | POA: Insufficient documentation

## 2017-12-03 DIAGNOSIS — J449 Chronic obstructive pulmonary disease, unspecified: Secondary | ICD-10-CM | POA: Insufficient documentation

## 2017-12-03 DIAGNOSIS — Z79899 Other long term (current) drug therapy: Secondary | ICD-10-CM | POA: Diagnosis not present

## 2017-12-03 DIAGNOSIS — Z7984 Long term (current) use of oral hypoglycemic drugs: Secondary | ICD-10-CM | POA: Diagnosis not present

## 2017-12-03 DIAGNOSIS — I10 Essential (primary) hypertension: Secondary | ICD-10-CM | POA: Insufficient documentation

## 2017-12-03 DIAGNOSIS — Z7901 Long term (current) use of anticoagulants: Secondary | ICD-10-CM | POA: Diagnosis not present

## 2017-12-03 DIAGNOSIS — R1030 Lower abdominal pain, unspecified: Secondary | ICD-10-CM | POA: Diagnosis present

## 2017-12-03 LAB — URINALYSIS, COMPLETE (UACMP) WITH MICROSCOPIC
Bilirubin Urine: NEGATIVE
GLUCOSE, UA: NEGATIVE mg/dL
Hgb urine dipstick: NEGATIVE
KETONES UR: NEGATIVE mg/dL
NITRITE: POSITIVE — AB
Protein, ur: NEGATIVE mg/dL
Specific Gravity, Urine: 1.01 (ref 1.005–1.030)
pH: 6 (ref 5.0–8.0)

## 2017-12-03 LAB — COMPREHENSIVE METABOLIC PANEL
ALT: 20 U/L (ref 0–44)
AST: 22 U/L (ref 15–41)
Albumin: 3.8 g/dL (ref 3.5–5.0)
Alkaline Phosphatase: 117 U/L (ref 38–126)
Anion gap: 9 (ref 5–15)
BUN: 16 mg/dL (ref 8–23)
CHLORIDE: 106 mmol/L (ref 98–111)
CO2: 26 mmol/L (ref 22–32)
CREATININE: 0.9 mg/dL (ref 0.44–1.00)
Calcium: 9.3 mg/dL (ref 8.9–10.3)
Glucose, Bld: 128 mg/dL — ABNORMAL HIGH (ref 70–99)
POTASSIUM: 4.1 mmol/L (ref 3.5–5.1)
SODIUM: 141 mmol/L (ref 135–145)
Total Bilirubin: 0.8 mg/dL (ref 0.3–1.2)
Total Protein: 7.6 g/dL (ref 6.5–8.1)

## 2017-12-03 LAB — CBC
HEMATOCRIT: 41.8 % (ref 36.0–46.0)
Hemoglobin: 13.6 g/dL (ref 12.0–15.0)
MCH: 29.1 pg (ref 26.0–34.0)
MCHC: 32.5 g/dL (ref 30.0–36.0)
MCV: 89.5 fL (ref 80.0–100.0)
NRBC: 0 % (ref 0.0–0.2)
Platelets: 176 10*3/uL (ref 150–400)
RBC: 4.67 MIL/uL (ref 3.87–5.11)
RDW: 13 % (ref 11.5–15.5)
WBC: 11.6 10*3/uL — AB (ref 4.0–10.5)

## 2017-12-03 LAB — LIPASE, BLOOD: Lipase: 19 U/L (ref 11–51)

## 2017-12-03 MED ORDER — ONDANSETRON HCL 4 MG/2ML IJ SOLN
4.0000 mg | Freq: Once | INTRAMUSCULAR | Status: AC
  Administered 2017-12-03: 4 mg via INTRAVENOUS

## 2017-12-03 MED ORDER — ONDANSETRON HCL 4 MG/2ML IJ SOLN
INTRAMUSCULAR | Status: AC
  Filled 2017-12-03: qty 2

## 2017-12-03 MED ORDER — MORPHINE SULFATE (PF) 4 MG/ML IV SOLN
INTRAVENOUS | Status: AC
  Filled 2017-12-03: qty 1

## 2017-12-03 MED ORDER — SODIUM CHLORIDE 0.9 % IV SOLN
1.0000 g | Freq: Once | INTRAVENOUS | Status: AC
  Administered 2017-12-03: 1 g via INTRAVENOUS
  Filled 2017-12-03: qty 10

## 2017-12-03 MED ORDER — SODIUM CHLORIDE 0.9 % IV BOLUS
1000.0000 mL | Freq: Once | INTRAVENOUS | Status: AC
  Administered 2017-12-03: 1000 mL via INTRAVENOUS

## 2017-12-03 MED ORDER — MORPHINE SULFATE (PF) 4 MG/ML IV SOLN
4.0000 mg | Freq: Once | INTRAVENOUS | Status: AC
  Administered 2017-12-03: 4 mg via INTRAVENOUS

## 2017-12-03 NOTE — ED Provider Notes (Signed)
[facility] Emergency Department Provider Note   ____________________________________________   First MD Initiated Contact with Patient 12/03/17 2306     (approximate)  I have reviewed the triage vital signs and the nursing notes.   HISTORY  Chief Complaint Flank Pain and Abdominal Pain    HPI Krista Frazier is a 67 y.o. female to the ED from home via EMS with a chief complaint of suprapubic abdominal pain and dysuria.  Patient reports symptoms x1 day.  Her sister recommended drinking cranberry juice but she does not like that.  Complains of suprapubic abdominal pain radiating to bilateral flanks associated with dysuria and urinary frequency.  Denies associated fever, chills, chest pain, shortness of breath, nausea, vomiting, diarrhea.  Denies recent travel or trauma.   Past Medical History:  Diagnosis Date  . Arthritis    "arms and knees" (12/15/2013)  . COPD (chronic obstructive pulmonary disease) (HCC)    [name] 12/14/2013  . Coronary artery disease   . Headache    "not too often" (12/15/2013)  . High cholesterol    [name] 12/14/2013  . Hypertension    [name] 12/14/2013  . Seizures (HCC)    "? kind" (12/15/2013)  . Stroke [facility])    "weak on left arm/leg since"  . Type II diabetes mellitus [facility])     Patient Active Problem List   Diagnosis Date Noted  . Coronary artery disease involving native coronary artery of native heart without angina pectoris   . Hx of aneurysm   . Chest pain 12/14/2013    Past Surgical History:  Procedure Laterality Date  . BACK SURGERY    . CARDIAC CATHETERIZATION  12/13/2013   [name] 12/14/2013  . CARPAL TUNNEL RELEASE Bilateral   . CERVICAL DISC SURGERY    . CORONARY ANGIOPLASTY WITH STENT PLACEMENT  2014   [name] 12/14/2013  . FOOT SURGERY Right    "took a bone out"  . NASAL SEPTUM SURGERY    . VAGINAL HYSTERECTOMY      Prior to Admission medications   Medication Sig Start Date End Date Taking?  Authorizing Provider  albuterol (PROVENTIL HFA;VENTOLIN HFA) 108 (90 BASE) MCG/ACT inhaler Inhale 2 puffs into the lungs every 6 (six) hours as needed for wheezing or shortness of breath.    [provider]  amLODipine (NORVASC) 5 MG tablet Take 1 tablet by mouth daily. 06/26/15   [provider]  aspirin EC 81 MG tablet Take 81 mg by mouth daily.    [provider]  clopidogrel (PLAVIX) 75 MG tablet Take 1 tablet by mouth daily. 06/26/15   [provider]  docusate sodium (COLACE) 100 MG capsule Take 100 mg by mouth at bedtime.     [provider]  Eslicarbazepine Acetate (APTIOM) 200 MG TABS Take 200 mg by mouth daily.    [provider]  fluticasone (FLONASE) 50 MCG/ACT nasal spray Place 2 sprays into both nostrils daily.    [provider]  Fluticasone-Salmeterol (ADVAIR) 500-50 MCG/DOSE AEPB Inhale 1 puff into the lungs 2 (two) times daily.    [provider]  gabapentin (NEURONTIN) 300 MG capsule Take 300-600 mg by mouth 3 (three) times daily. 1 capsule twice daily and 2 capsules at bedtime    [provider]  glimepiride (AMARYL) 2 MG tablet Take 2 mg by mouth daily with breakfast.    [provider]  isosorbide mononitrate (IMDUR) 30 MG 24 hr tablet Take 30 mg by mouth daily.  [provider]  lacosamide (VIMPAT) 200 MG TABS tablet Take 200 mg by mouth 2 (two) times daily.    [provider]  lisinopril (PRINIVIL,ZESTRIL) 20 MG tablet Take 20 mg by mouth daily.    [provider]  metoprolol succinate (TOPROL-XL) 25 MG 24 hr tablet Take 1 tablet (25 mg total) by mouth daily. 12/18/13   Doree Fudge M, PA-C  montelukast (SINGULAIR) 10 MG tablet Take 10 mg by mouth at bedtime.    [provider]  Ethelda Chick (OYSTER CALCIUM) 500 MG TABS tablet Take 500 mg of elemental calcium by mouth daily.    [provider]  pantoprazole (PROTONIX) 40 MG tablet Take  40 mg by mouth daily.    [provider]  potassium chloride SA (K-DUR,KLOR-CON) 20 MEQ tablet Take 20 mEq by mouth daily.    [provider]  ranitidine (ZANTAC) 150 MG tablet Take 150 mg by mouth at bedtime.    [provider]  ranolazine (RANEXA) 500 MG 12 hr tablet Take 1 tablet (500 mg total) by mouth 2 (two) times daily. 12/18/13   Ardelle Balls, PA-C  simvastatin (ZOCOR) 20 MG tablet Take 1 tablet by mouth daily. 06/26/15   [provider]  simvastatin (ZOCOR) 5 MG tablet Take 5 mg by mouth daily.    [provider]  solifenacin (VESICARE) 10 MG tablet Take 10 mg by mouth daily.    [provider]  VOLTAREN 1 % GEL Apply 1 application topically as needed. 06/04/15   [provider]  warfarin (COUMADIN) 5 MG tablet Take 1 tablet by mouth daily. 06/26/15   [provider]    Allergies Patient has no known allergies.  No family history on file.  Social History Social History   Tobacco Use  . Smoking status: Never Smoker  . Smokeless tobacco: Never Used  Substance Use Topics  . Alcohol use: No  . Drug use: No    Review of Systems  Constitutional: No fever/chills Eyes: No visual changes. ENT: No sore throat. Cardiovascular: Denies chest pain. Respiratory: Denies shortness of breath. Gastrointestinal: Positive for abdominal pain and flank pain.  No nausea, no vomiting.  No diarrhea.  No constipation. Genitourinary: Positive for dysuria. Musculoskeletal: Negative for back pain. Skin: Negative for rash. Neurological: Negative for headaches, focal weakness or numbness.   ____________________________________________   PHYSICAL EXAM:  VITAL SIGNS: ED Triage Vitals  Enc Vitals Group     BP 12/03/17 2203 (!) 158/106     Pulse Rate 12/03/17 2203 (!) 50     Resp 12/03/17 2203 18     Temp 12/03/17 2203 98.4 F (36.9 C)     Temp Source 12/03/17 2203 Oral     SpO2 12/03/17 2203 100 %     Weight  12/03/17 2204 163 lb 2.3 oz (74 kg)     Height --      Head Circumference --      Peak Flow --      Pain Score 12/03/17 2204 10     Pain Loc --      Pain Edu? --      Excl. in GC? --     Constitutional: Alert and oriented. Well appearing and in no acute distress. Eyes: Conjunctivae are normal. PERRL. EOMI. Head: Atraumatic. Nose: No congestion/rhinnorhea. Mouth/Throat: Mucous membranes are moist.  Oropharynx non-erythematous. Neck: No stridor.   Cardiovascular: Normal rate, regular rhythm. Grossly normal heart sounds.  Good peripheral circulation. Respiratory: Normal respiratory  effort.  No retractions. Lungs CTAB. Gastrointestinal: Soft and mildly tender to palpation suprapubic area without rebound or guarding. No distention. No abdominal bruits. No CVA tenderness. Musculoskeletal: No lower extremity tenderness nor edema.  No joint effusions. Neurologic:  Normal speech and language. No gross focal neurologic deficits are appreciated. No gait instability. Skin:  Skin is warm, dry and intact. No rash noted. Psychiatric: Mood and affect are normal. Speech and behavior are normal.  ____________________________________________   LABS (all labs ordered are listed, but only abnormal results are displayed)  Labs Reviewed  COMPREHENSIVE METABOLIC PANEL - Abnormal; Notable for the following components:      Result Value   Glucose, Bld 128 (*)    All other components within normal limits  CBC - Abnormal; Notable for the following components:   WBC 11.6 (*)    All other components within normal limits  URINALYSIS, COMPLETE (UACMP) WITH MICROSCOPIC - Abnormal; Notable for the following components:   Color, Urine YELLOW (*)    APPearance CLEAR (*)    Nitrite POSITIVE (*)    Leukocytes, UA TRACE (*)    Bacteria, UA RARE (*)    All other components within normal limits  URINE CULTURE  LIPASE, BLOOD  LACTIC ACID, PLASMA    ____________________________________________  EKG  None ____________________________________________  RADIOLOGY  ED MD interpretation: No kidney stones; cholelithiasis  Official radiology report(s): Ct Renal Stone Study  Result Date: 12/03/2017 CLINICAL DATA:  Acute onset of lower abdominal pain radiating to both flanks. Dysuria. EXAM: CT ABDOMEN AND PELVIS WITHOUT CONTRAST TECHNIQUE: Multidetector CT imaging of the abdomen and pelvis was performed following the standard protocol without IV contrast. COMPARISON:  CT of the abdomen and pelvis from 12/16/2013 FINDINGS: Lower chest: The visualized lung bases are grossly clear. The visualized portions of the mediastinum are unremarkable. Hepatobiliary: The liver is unremarkable in appearance. A small stone is noted within the gallbladder. The gallbladder is otherwise unremarkable. The common bile duct remains normal in caliber. Pancreas: The pancreas is within normal limits. Spleen: The spleen is unremarkable in appearance. Adrenals/Urinary Tract: The adrenal glands are unremarkable in appearance. The kidneys are within normal limits. There is no evidence of hydronephrosis. No renal or ureteral stones are identified. No perinephric stranding is seen. Stomach/Bowel: The stomach is unremarkable in appearance. The small bowel is within normal limits. The appendix is normal in caliber, without evidence of appendicitis. The colon is unremarkable in appearance. Vascular/Lymphatic: Mild scattered calcification is seen along the abdominal aorta and its branches. No retroperitoneal or pelvic sidewall lymphadenopathy is seen. Reproductive: The bladder is mildly distended and grossly unremarkable. The patient is status post hysterectomy. No suspicious adnexal masses are seen. Other: No additional soft tissue abnormalities are seen. Musculoskeletal: No acute osseous abnormalities are identified. Multilevel vacuum phenomenon is noted along the lumbar spine. Facet  disease is noted at the lower lumbar spine. The visualized musculature is unremarkable in appearance. IMPRESSION: 1. No acute abnormality seen within the abdomen or pelvis. 2. Cholelithiasis. Gallbladder otherwise unremarkable. Aortic Atherosclerosis (ICD10-I70.0). Electronically Signed   By: Roanna Raider M.D.   On: 12/03/2017 23:43    ____________________________________________   PROCEDURES  Procedure(s) performed: None  Procedures  Critical Care performed: No  ____________________________________________   INITIAL IMPRESSION / ASSESSMENT AND PLAN / ED COURSE  As part of my medical decision making, I reviewed the following data within the electronic MEDICAL RECORD NUMBER Nursing notes reviewed and incorporated, Labs reviewed, Old chart reviewed, Radiograph reviewed  and Notes from  prior ED visits   67 year old female who presents with suprapubic abdominal pain, flank pain and dysuria. Differential diagnosis includes, but is not limited to, ovarian cyst, ovarian torsion, acute appendicitis, diverticulitis, urinary tract infection/pyelonephritis, endometriosis, bowel obstruction, colitis, renal colic, gastroenteritis, hernia, fibroids, endometriosis, etc.  Patient is afebrile with minimally elevated WBC, normal kidney function and evidence of UTI.  Patient received IM Toradol per EMS and IV Morphine prior to my arrival.  Pain is currently well controlled.  Will initiate IV fluid resuscitation, 1 g IV Rocephin, and urine culture and obtain CT renal colic study to evaluate for obstructing stones.  Will reassess.   Clinical Course as of Dec 05 635  Sat Dec 04, 2017  0013 Patient resting no acute distress.  Updated her of all test results.  Patient hallucinated family members in the lobby after IV morphine administration.  She has no one to pick her up until morning anyway.  We will board her in the ED overnight and continue to observe to clear sensorium.   [JS]  0227 Patient sleeping  soundly no acute distress.  We will continue to monitor.   [JS]  [id number] Patient got up to use the bathroom.  Ambulated with steady gait.  Clear sensorium.  States she has someone at home to help her.  Will call her sister after daybreak to transport her home.  Will discharge home on antibiotic and she will follow-up closely with her PCP.  Strict return precautions given.  Patient verbalizes understanding and agrees with plan of care.   [JS]    Clinical Course User Index [JS] Irean Hong, MD     ____________________________________________   FINAL CLINICAL IMPRESSION(S) / ED DIAGNOSES  Final diagnoses:  Lower abdominal pain  Lower urinary tract infectious disease  Calculus of gallbladder without cholecystitis without obstruction     ED Discharge Orders    None       Note:  This document was prepared using Dragon voice recognition software and may include unintentional dictation errors.    Irean Hong, MD 12/04/17 [phone]

## 2017-12-03 NOTE — ED Notes (Signed)
ED Provider at bedside. 

## 2017-12-03 NOTE — ED Notes (Signed)
Patient asked this RN to get her family members from the lobby. This RN informed patient that she did not have any family members at this ED. Patient insisted that she had just seen them in the lobby. Patient informed that she had not been in the lobby, and therefore could not have seen her family members. MD Dolores Frame informed.

## 2017-12-03 NOTE — ED Triage Notes (Signed)
Patient coming [facility] from home for lower abdominal pain radiating to bilateral flanks/back. Patient c/o dysuria.   EMS administered 60 IM Toradol in route.  EMS vitals: BP 166 systolic, HR in the 50s, 97-100% on RA

## 2017-12-03 NOTE — ED Notes (Signed)
Patient tearful. Patient asking staff to stay with her in room. Patient appears anxious. RN discussed that it is not possible to stay in room constantly, however, per patient request left door open. RN further discussed that she would round frequently, and made sure patient had, and was aware of call bell location to call for assistance as needed.

## 2017-12-04 LAB — LACTIC ACID, PLASMA: Lactic Acid, Venous: 0.9 mmol/L (ref 0.5–1.9)

## 2017-12-04 MED ORDER — CEPHALEXIN 500 MG PO CAPS: 500.0000 mg | ORAL_CAPSULE | Freq: Three times a day (TID) | ORAL | 0 refills | Status: DC

## 2017-12-04 NOTE — ED Notes (Addendum)
Call to sister Myrtlean called and she will call her nephew and will come pick her up. If there is a problem she will call back.

## 2017-12-04 NOTE — Discharge Instructions (Signed)
1.  Take antibiotic as prescribed (Keflex 500 mg 3 times daily x7 days). 2.  Return to the ER for worsening symptoms, persistent vomiting, difficulty breathing or other concerns. 

## 2017-12-04 NOTE — ED Notes (Signed)
Report from Jen, RN

## 2017-12-04 NOTE — ED Notes (Signed)
Pt up to bedside commode with mod assistance. Pt states she can take care of herself at home and that she has someone who comes and helps her at her home. Pt states she doesn't recall talking about her sister in the waiting room and does believe if we call her sister she should be able to come and pick her up. EDP aware. Will call sister

## 2017-12-04 NOTE — ED Notes (Signed)
Pt unable to follow direction well or lift legs up off the bed to help put socks on. EDP notified since pt lives alone. Will reassess closer to 7am to see if pt needs social work consult

## 2017-12-06 LAB — URINE CULTURE: Culture: >=100000 — AB

## 2020-09-05 ENCOUNTER — Other Ambulatory Visit: Payer: Self-pay

## 2020-09-05 ENCOUNTER — Encounter: Payer: Self-pay | Admitting: Internal Medicine

## 2020-09-05 ENCOUNTER — Ambulatory Visit (INDEPENDENT_AMBULATORY_CARE_PROVIDER_SITE_OTHER): Payer: 59 | Admitting: Internal Medicine

## 2020-09-05 DIAGNOSIS — R0609 Other forms of dyspnea: Secondary | ICD-10-CM | POA: Insufficient documentation

## 2020-09-05 DIAGNOSIS — R0600 Dyspnea, unspecified: Secondary | ICD-10-CM

## 2020-09-05 MED ORDER — FLUTICASONE FUROATE-VILANTEROL 100-25 MCG/INH IN AEPB: 1.0000 | INHALATION_SPRAY | Freq: Every morning | RESPIRATORY_TRACT | 11 refills | Status: AC

## 2020-09-05 NOTE — Assessment & Plan Note (Addendum)
Onset ? In childhood carries dx of chronic asthma  -  PFT's 12/15/13 no obstruction - off acei since 02/2017  - 09/05/2020  After extensive coaching inhaler device,  effectiveness =    75% with dpi so change advair 500 to BREO  100 once each am - 09/05/2020   Walked on RA x  3  lap(s) =  approx 468ft @ avg pace, stopped due to end of study, a bit tired but no sob with lowest 02 sats 99%    DDX of  difficult airways management almost all start with A and  include Adherence, Ace Inhibitors, Acid Reflux, Active Sinus Disease, Alpha 1 Antitripsin deficiency, Anxiety masquerading as Airways dz,  ABPA,  Allergy(esp in young), Aspiration (esp in elderly), Adverse effects of meds,  Active smoking or vaping, A bunch of PE's (a small clot burden can't cause this syndrome unless there is already severe underlying pulm or vascular dz with poor reserve) plus two Bs  = Bronchiectasis and Beta blocker use..and one C= CHF   Adherence is always the initial "prime suspect" and is a multilayered concern that requires a "trust but verify" approach in every patient - starting with knowing how to use medications, especially inhalers, correctly, keeping up with refills and understanding the fundamental difference between maintenance and prns vs those medications only taken for a very short course and then stopped and not refilled.  - taught her how to inhale and hold breath using dpi   ? Allergy /asthma > baseline pfts nl but that does not rule out asthma>>   continue symbicort and try BREO 100 as may be more effective than advair 500 with less irritating effects on the upper airway  Re saba advised: I spent extra time with pt today reviewing appropriate use of albuterol for prn use on exertion with the following points: 1) saba is for relief of sob that does not improve by walking a slower pace or resting but rather if the pt does not improve after trying this first. 2) If the pt is convinced, as many are, that saba helps  recover from activity faster then it's easy to tell if this is the case by re-challenging : ie stop, take the inhaler, then p 5 minutes try the exact same activity (intensity of workload) that just caused the symptoms and see if they are substantially diminished or not after saba 3) if there is an activity that reproducibly causes the symptoms, try the saba 15 min before the activity on alternate days  If in fact the saba really does help, then fine to continue to use it prn but advised may need to look closer at the maintenance regimen being used to achieve better control of airways disease with exertion.   ? Acid (or non-acid) GERD > always difficult to exclude as up to 75% of pts in some series report no assoc GI/ Heartburn symptoms> rec continue max (24h)  acid suppression    ? ? Anxiety/depression / deconditioning  > usually at the bottom of this list of usual suspects but   may interfere with adherence and also interpretation of response or lack thereof to symptom management which can be quite subjective.    ?  Adverse effects of meds > advair 500 is my only concern - I never use is as the high dose irritates the upper airway enough to mimick asthma   ? A bunch of PEs > unlikely on anticoagulation  ? chf / cardiac asthma >  no leg swelling, no orthopnea or pnd so seems unlikely but should stay in ddx esp if CM on cxr   Pulmonary f/u is prn   Each maintenance medication was reviewed in detail including emphasizing most importantly the difference between maintenance and prns and under what circumstances the prns are to be triggered using an action plan format where appropriate.  Total time for H and P, chart review, counseling, reviewing hfa device(s) , directly observing portions of ambulatory 02 saturation study/ and generating customized AVS unique to this office visit / same day charting = 60 min
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
## 2020-09-05 NOTE — Patient Instructions (Signed)
Stop advair   Plan A = Automatic = Always=    Breo 100 one click each am  Work on inhaler technique:  relax and gently blow all the way out then take a nice smooth full deep breath back in.   Hold for up to 5 seconds if you can. Blow out thru nose. Rinse and gargle with water when done.  If mouth or throat bother you at all,  try brushing teeth/gums/tongue with arm and hammer toothpaste/ make a slurry and gargle and spit out.       Plan B = Backup (to supplement plan A, not to replace it) Only use your albuterol inhaler as a rescue medication to be used if you can't catch your breath by resting or doing a relaxed purse lip breathing pattern.  - The less you use it, the better it will work when you need it. - Ok to use the inhaler up to 2 puffs  every 4 hours if you must but call for appointment if use goes up over your usual need - Don't leave home without it !!  (think of it like the spare tire for your car)      Return if not satisfied with breathing

## 2020-11-07 ENCOUNTER — Telehealth: Payer: Self-pay | Admitting: Internal Medicine

## 2020-11-07 NOTE — Telephone Encounter (Signed)
Primary Pulmonologist: WERT Last office visit and with whom: 09/05/2020 Wert  What do we see them for (pulmonary problems): DOE Last OV assessment/plan: See below   Was appointment offered to patient (explain)?  No soonest available: 12/03/20    Reason for call:  ATC patient for more details. No VM available.   Pt a little more short of breath.  Wanted appt, but no availability for [place] until 11/28.  Please advise  Dr. [name] please advise   Allergies  Allergen Reactions   Morphine And Related Other (See Comments)    hallucinations    Immunization History  Administered Date(s) Administered   Influenza,inj,quad, With Preservative 11/01/2017   Influenza-Unspecified 10/14/2017            Assessment & Plan Note by Nyoka Cowden, MD at 09/05/2020 4:44 PM  Author: Nyoka Cowden, MD Author Type: Physician Filed: 09/05/2020  4:54 PM  Note Status: Carlisle Cater: Cosign Not Required Encounter Date: 09/05/2020  Problem: DOE (dyspnea on exertion)  Editor: Nyoka Cowden, MD (Physician)      Prior Versions: 1. Nyoka Cowden, MD (Physician) at 09/05/2020  4:51 PM - [facility]n   2. Nyoka Cowden, MD (Physician) at 09/05/2020  4:51 PM - Edited   3. Nyoka Cowden, MD (Physician) at 09/05/2020  4:50 PM - Written  Onset ? In childhood carries dx of chronic asthma  -  PFT's 12/15/13 no obstruction - off acei since 02/2017  - 09/05/2020  After extensive coaching inhaler device,  effectiveness =    75% with dpi so change advair 500 to BREO  100 once each am - 09/05/2020   Walked on RA x  3  lap(s) =  approx 456ft @ avg pace, stopped due to end of study, a bit tired but no sob with lowest 02 sats 99%     DDX of  difficult airways management almost all start with A and  include Adherence, Ace Inhibitors, Acid Reflux, Active Sinus Disease, Alpha 1 Antitripsin deficiency, Anxiety masquerading as Airways dz,  ABPA,  Allergy(esp in young), Aspiration (esp in elderly), Adverse effects of meds,   Active smoking or vaping, A bunch of PE's (a small clot burden can't cause this syndrome unless there is already severe underlying pulm or vascular dz with poor reserve) plus two Bs  = Bronchiectasis and Beta blocker use..and one C= CHF    Adherence is always the initial "prime suspect" and is a multilayered concern that requires a "trust but verify" approach in every patient - starting with knowing how to use medications, especially inhalers, correctly, keeping up with refills and understanding the fundamental difference between maintenance and prns vs those medications only taken for a very short course and then stopped and not refilled.  - taught her how to inhale and hold breath using dpi    ? Allergy /asthma > baseline pfts nl but that does not rule out asthma>>   continue symbicort and try BREO 100 as may be more effective than advair 500 with less irritating effects on the upper airway  Re saba advised: I spent extra time with pt today reviewing appropriate use of albuterol for prn use on exertion with the following points: 1) saba is for relief of sob that does not improve by walking a slower pace or resting but rather if the pt does not improve after trying this first. 2) If the pt is convinced, as many are, that saba helps recover from activity faster then  it's easy to tell if this is the case by re-challenging : ie stop, take the inhaler, then p 5 minutes try the exact same activity (intensity of workload) that just caused the symptoms and see if they are substantially diminished or not after saba 3) if there is an activity that reproducibly causes the symptoms, try the saba 15 min before the activity on alternate days  If in fact the saba really does help, then fine to continue to use it prn but advised may need to look closer at the maintenance regimen being used to achieve better control of airways disease with exertion.    ? Acid (or non-acid) GERD > always difficult to exclude as up to  75% of pts in some series report no assoc GI/ Heartburn symptoms> rec continue max (24h)  acid suppression     ? ? Anxiety/depression / deconditioning  > usually at the bottom of this list of usual suspects but   may interfere with adherence and also interpretation of response or lack thereof to symptom management which can be quite subjective.    ?  Adverse effects of meds > advair 500 is my only concern - I never use is as the high dose irritates the upper airway enough to mimick asthma    ? A bunch of PEs > unlikely on anticoagulation   ? chf / cardiac asthma > no leg swelling, no orthopnea or pnd so seems unlikely but should stay in ddx esp if CM on cxr    Pulmonary f/u is prn    Each maintenance medication was reviewed in detail including emphasizing most importantly the difference between maintenance and prns and under what circumstances the prns are to be triggered using an action plan format where appropriate.   Total time for H and P, chart review, counseling, reviewing hfa device(s) , directly observing portions of ambulatory 02 saturation study/ and generating customized AVS unique to this office visit / same day charting = 60 min

## 2020-11-07 NOTE — Telephone Encounter (Signed)
Spoke with the pt and scheduled appt for 11/28/20 at 2 pm pt arrive appt is at 1:30 and needs to arrive by 1:15

## 2020-11-07 NOTE — Telephone Encounter (Signed)
Patient states she cannot come to the office tomorrow due to short notice. She needs at least a week in advance notice due to transportation.   Dr. [name] please advise

## 2020-11-07 NOTE — Telephone Encounter (Signed)
Add on a Tuesday or Thursday to [place] office in pm

## 2020-11-07 NOTE — Telephone Encounter (Signed)
Ok after lunch 10/14 but must bring all meds/inhalers/solutions

## 2020-11-28 ENCOUNTER — Ambulatory Visit (INDEPENDENT_AMBULATORY_CARE_PROVIDER_SITE_OTHER): Payer: 59 | Admitting: Internal Medicine

## 2020-11-28 ENCOUNTER — Encounter: Payer: Self-pay | Admitting: Internal Medicine

## 2020-11-28 ENCOUNTER — Other Ambulatory Visit: Payer: Self-pay

## 2020-11-28 DIAGNOSIS — R0609 Other forms of dyspnea: Secondary | ICD-10-CM
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
## 2020-11-28 NOTE — Patient Instructions (Signed)
If you are satisfied with your treatment plan,  let your doctor know and he/she can either refill your medications or you can return here in a year for this.    If in any way you are not 100% satisfied,  please tell us.  If 100% better, tell your friends!  Pulmonary follow up is as needed

## 2020-11-28 NOTE — Assessment & Plan Note (Addendum)
Onset ? In childhood carries dx of chronic asthma  -  PFT's 12/15/13 no obstruction  - off acei since 02/2017  - 09/05/2020  After extensive coaching inhaler device,  effectiveness =    75% with dpi so change advair 500 to BREO  100 once each am - 09/05/2020   Walked on RA x  3  lap(s) =  approx 459ft @ avg pace, stopped due to end of study, a bit tired but no sob with lowest 02 sats 99%   - 11/28/2020   Walked on RA x  3  lap(s) =  approx490ft @ moderate pace, stopped due to dizzy so cp/sob  with lowest 02 sats 99%   No evidence of a cardiopulmonary limitation but does have significant overall geriactric decline and I encouraged her to continue daily driveway ex to maintain cardiovascular fitness but pulmonary f/u is not needed at this time.  F/u is prn   - The proper method of use, as well as anticipated side effects, of a metered-dose inhaler were discussed and demonstrated to the patient using teach back method.    Each maintenance medication was reviewed in detail including emphasizing most importantly the difference between maintenance and prns and under what circumstances the prns are to be triggered using an action plan format where appropriate.  Total time for H and P, chart review, counseling, reviewing hfa/dpi device(s) , directly observing portions of ambulatory 02 saturation study/ and generating customized AVS unique to this office visit / same day charting = 28 min

## 2021-03-13 ENCOUNTER — Emergency Department
Admission: EM | Admit: 2021-03-13 | Discharge: 2021-03-13 | Disposition: A | Discharge status: Home / Self Care | Payer: 59 | Attending: Emergency Medicine | Admitting: Emergency Medicine

## 2021-03-13 ENCOUNTER — Other Ambulatory Visit: Payer: Self-pay

## 2021-03-13 DIAGNOSIS — E119 Type 2 diabetes mellitus without complications: Secondary | ICD-10-CM | POA: Diagnosis not present

## 2021-03-13 DIAGNOSIS — I10 Essential (primary) hypertension: Secondary | ICD-10-CM | POA: Diagnosis present

## 2021-03-13 DIAGNOSIS — I2510 Atherosclerotic heart disease of native coronary artery without angina pectoris: Secondary | ICD-10-CM | POA: Insufficient documentation

## 2021-03-13 DIAGNOSIS — J449 Chronic obstructive pulmonary disease, unspecified: Secondary | ICD-10-CM | POA: Insufficient documentation

## 2021-03-13 NOTE — ED Provider Notes (Incomplete)
° °  [facility] Provider Note    Event Date/Time   First MD Initiated Contact with Patient 03/13/21 1529     (approximate)   History   Chief Complaint Hypertension   HPI Krista Frazier is a 71 y.o. female  ***   200/161    Per external records review, ***  History Limitations: ***      Physical Exam  Triage Vital Signs: ED Triage Vitals  Enc Vitals Group     BP 03/13/21 1449 (!) 144/88     Pulse Rate 03/13/21 1449 72     Resp 03/13/21 1449 18     Temp 03/13/21 1449 98 F (36.7 C)     Temp src --      SpO2 03/13/21 1449 100 %     Weight --      Height --      Head Circumference --      Peak Flow --      Pain Score 03/13/21 1451 0     Pain Loc --      Pain Edu? --      Excl. in [place]? --     Most recent vital signs: Vitals:   03/13/21 1449  BP: (!) 144/88  Pulse: 72  Resp: 18  Temp: 98 F (36.7 C)  SpO2: 100%    General: Awake, NAD. *** CV: Good peripheral perfusion. *** Resp: Normal effort. *** Abd: Soft, non-tender. No distention. *** Neuro: At baseline. No gross neurological deficits. Other: ***  Physical Exam    ED Results / Procedures / Treatments  Labs (all labs ordered are listed, but only abnormal results are displayed) Labs Reviewed - No data to display   EKG ***   RADIOLOGY  ED Provider Interpretation: ***  No results found.  PROCEDURES:  Critical Care performed: ***  Procedures    MEDICATIONS ORDERED IN ED: Medications - No data to display   IMPRESSION / MDM / [place] / ED COURSE  I reviewed the triage vital signs and the nursing notes.                              Krista Frazier is a 71 y.o. female ***  Differential diagnosis includes, but is not limited to, ***  ED Course ***  Assessment/Plan ***  ***Patient was provided with anticipatory guidance, return precautions, and educational material. Encouraged the patient to return to the emergency department at  any time if they begin to experience any new or worsening symptoms.       FINAL CLINICAL IMPRESSION(S) / ED DIAGNOSES   Final diagnoses:  None     Rx / DC Orders   ED Discharge Orders     None        Note:  This document was prepared using Dragon voice recognition software and may include unintentional dictation errors.

## 2021-03-13 NOTE — ED Triage Notes (Signed)
Pt comes into the ED via [facility] from home c/o HTN and anxiety.  Home BP was 171/90.  Pt has been tearful with EMS.  Pt was told by her cardiologist that if he BP increases she should be evaluated.  Pt in NAD with even and unlabored respirations.   80 HR 170/98 98% RA

## 2021-03-13 NOTE — ED Triage Notes (Signed)
Pt comes with c/o HTN. Pt states no pain or dizziness or headache. Pt states her MD told her to come here next time her BP was elevated.

## 2021-03-13 NOTE — ED Provider Notes (Signed)
[facility] Provider Note    Event Date/Time   First MD Initiated Contact with Patient 03/13/21 1529     (approximate)   History   Chief Complaint Hypertension   HPI Krista Frazier is a 71 y.o. female, history of CAD, stroke, type 2 diabetes, COPD, hypertension, and hyperlipidemia, presents to the emergency department for evaluation of hypertension.  Patient states that she was taken her blood pressure this morning as part of her daily routine when she noticed that her blood pressure was 205/160.  She states that she did not feel any active symptoms at the time.  She states that she was told by her cardiologist that if her blood pressure increases that she should be evaluated, prompting her to call 911.  Upon arrival to the emergency department, her blood pressure was 140/88.  She continues to be asymptomatic.  Denies chest pain, shortness of breath, abdominal pain, back pain, headache, nausea/vomiting, dizziness, or weakness  History Limitations: No limitations      Physical Exam  Triage Vital Signs: ED Triage Vitals  Enc Vitals Group     BP 03/13/21 1449 (!) 144/88     Pulse Rate 03/13/21 1449 72     Resp 03/13/21 1449 18     Temp 03/13/21 1449 98 F (36.7 C)     Temp src --      SpO2 03/13/21 1449 100 %     Weight --      Height --      Head Circumference --      Peak Flow --      Pain Score 03/13/21 1451 0     Pain Loc --      Pain Edu? --      Excl. in [place]? --     Most recent vital signs: Vitals:   03/13/21 1449 03/13/21 1721  BP: (!) 144/88 (!) 151/78  Pulse: 72 74  Resp: 18 18  Temp: 98 F (36.7 C)   SpO2: 100% 98%    General: Awake, NAD.  CV: Good peripheral perfusion.  S1 and S2 present.  No murmurs rubs or gallops. Resp: Normal effort.  Lung sounds clear bilaterally in the apices and bases Abd: Soft, non-tender. No distention.  Neuro: At baseline. No gross neurological deficits. Other: Cranial nerves II through XII  intact.  Normal cerebellar function.  Patient is able to ambulate without exertional dyspnea or chest pain.  Physical Exam    ED Results / Procedures / Treatments  Labs (all labs ordered are listed, but only abnormal results are displayed) Labs Reviewed - No data to display   EKG Sinus rhythm, rate 65, no T-segment elevations or depressions, no AV blocks, no axis deviations.   RADIOLOGY  ED Provider Interpretation: Not applicable  No results found.  PROCEDURES:  Critical Care performed: Not applicable  Procedures    MEDICATIONS ORDERED IN ED: Medications - No data to display   IMPRESSION / MDM / ASSESSMENT AND PLAN / ED COURSE  I reviewed the triage vital signs and the nursing notes.                              Krista Frazier is a 71 y.o. female, history of CAD, stroke, type 2 diabetes, COPD, hypertension, and hyperlipidemia, presents to the emergency department for evaluation of hypertension.  Patient states that she was taken her blood pressure this morning as part of  her daily routine when she noticed that her blood pressure was 205/160.  She states that she did not feel any active symptoms at the time.  She states that she was told by her cardiologist that if her blood pressure increases that she should be evaluated, prompting her to call 911.  Upon arrival to the emergency department, her blood pressure was 140/88.  She continues to be asymptomatic.  Differential diagnosis includes, but is not limited to, hypertension, hypertensive crisis, ACS.   ED Course Patient appears well.  Vital signs within normal limits.  Blood pressure is 144/88.  NAD.  EKG is unremarkable for acute pathology.  Unlikely ACS.   Assessment/Plan Given the patient's history, physical exam, and EKG, I do not suspect any serious pathology.  This appears to be a transient, asymptomatic episode of hypertension.  She is currently already on amlodipine, losartan, and metoprolol daily for her  hypertension.  She states that she has been taking her medications regularly.  Her blood pressure here in the emergency department is within normal limits for her. Patient is not experiencing any cardiac or neurological symptoms. No further work-up or treatment indicated at this time.  Patient states that her next appointment with her family doctor is next week on Monday.  We will plan to discharge this patient with primary care follow-up.  Patient was provided with anticipatory guidance, return precautions, and educational material. Encouraged the patient to return to the emergency department at any time if they begin to experience any new or worsening symptoms.       FINAL CLINICAL IMPRESSION(S) / ED DIAGNOSES   Final diagnoses:  Hypertension, unspecified type     Rx / DC Orders   ED Discharge Orders     None        Note:  This document was prepared using Dragon voice recognition software and may include unintentional dictation errors.   Varney Daily, [place] 03/14/21 0042    Gilles Chiquito, MD 03/14/21 [phone]

## 2021-03-13 NOTE — Discharge Instructions (Addendum)
-  Continue to take your blood pressure medications as usual. -Follow-up with your primary care provider next week, as discussed. -Return to the emergency department at any time if you begin to experience any new or worsening symptoms

## 2021-10-16 ENCOUNTER — Other Ambulatory Visit: Payer: Self-pay

## 2021-10-16 MED ORDER — FLUTICASONE FUROATE-VILANTEROL 100-25 MCG/ACT IN AEPB: 1.0000 | INHALATION_SPRAY | Freq: Every day | RESPIRATORY_TRACT | 6 refills | Status: AC

## 2022-01-12 ENCOUNTER — Other Ambulatory Visit: Payer: Self-pay | Admitting: Family Medicine

## 2022-01-12 DIAGNOSIS — Z1231 Encounter for screening mammogram for malignant neoplasm of breast: Secondary | ICD-10-CM

## 2022-02-05 ENCOUNTER — Ambulatory Visit
Admission: RE | Admit: 2022-02-05 | Discharge: 2022-02-05 | Disposition: A | Discharge status: Home / Self Care | Payer: Medicare Other | Source: Ambulatory Visit | Attending: Family Medicine | Admitting: Family Medicine

## 2022-02-05 DIAGNOSIS — Z1231 Encounter for screening mammogram for malignant neoplasm of breast: Secondary | ICD-10-CM | POA: Diagnosis present

## 2022-02-11 ENCOUNTER — Inpatient Hospital Stay
Admission: RE | Admit: 2022-02-11 | Discharge: 2022-02-11 | Disposition: A | Discharge status: Home / Self Care | Payer: Self-pay | Source: Ambulatory Visit | Attending: *Deleted | Admitting: *Deleted

## 2022-02-11 ENCOUNTER — Other Ambulatory Visit: Payer: Self-pay | Admitting: *Deleted

## 2022-02-11 DIAGNOSIS — Z1231 Encounter for screening mammogram for malignant neoplasm of breast: Secondary | ICD-10-CM

## 2022-05-20 ENCOUNTER — Ambulatory Visit: Payer: 59 | Admitting: Podiatry

## 2022-05-20 ENCOUNTER — Ambulatory Visit: Payer: 59 | Admitting: Podiatrist

## 2023-01-01 ENCOUNTER — Other Ambulatory Visit: Payer: Self-pay | Admitting: Family Medicine

## 2023-01-01 DIAGNOSIS — Z1231 Encounter for screening mammogram for malignant neoplasm of breast: Secondary | ICD-10-CM

## 2023-02-12 ENCOUNTER — Ambulatory Visit
Admission: RE | Admit: 2023-02-12 | Discharge: 2023-02-12 | Disposition: A | Discharge status: Home / Self Care | Payer: 59 | Source: Ambulatory Visit | Attending: Family Medicine | Admitting: Family Medicine

## 2023-02-12 DIAGNOSIS — Z1231 Encounter for screening mammogram for malignant neoplasm of breast: Secondary | ICD-10-CM | POA: Diagnosis present

## 2024-01-05 ENCOUNTER — Other Ambulatory Visit: Payer: Self-pay | Admitting: Family Medicine

## 2024-01-05 DIAGNOSIS — Z1231 Encounter for screening mammogram for malignant neoplasm of breast: Secondary | ICD-10-CM

## 2024-02-14 ENCOUNTER — Encounter

## 2024-03-08 ENCOUNTER — Encounter
